# Patient Record
Sex: Male | Born: 1935 | Smoking: Former smoker
Health system: Southern US, Community
[De-identification: ages and names within clinical notes are randomized; demographics above are authoritative.]

## PROBLEM LIST (undated history)

## (undated) DIAGNOSIS — R569 Unspecified convulsions: Secondary | ICD-10-CM

## (undated) DIAGNOSIS — J45909 Unspecified asthma, uncomplicated: Secondary | ICD-10-CM

## (undated) DIAGNOSIS — C61 Malignant neoplasm of prostate: Secondary | ICD-10-CM

## (undated) DIAGNOSIS — J439 Emphysema, unspecified: Secondary | ICD-10-CM

## (undated) HISTORY — DX: Malignant neoplasm of prostate: C61

## (undated) HISTORY — PX: HERNIA REPAIR: SHX51

---

## 2014-03-09 DIAGNOSIS — R627 Adult failure to thrive: Secondary | ICD-10-CM | POA: Insufficient documentation

## 2014-09-11 DIAGNOSIS — F0390 Unspecified dementia without behavioral disturbance: Secondary | ICD-10-CM | POA: Insufficient documentation

## 2017-04-28 ENCOUNTER — Encounter: Payer: Self-pay | Admitting: Neurology

## 2017-07-06 ENCOUNTER — Other Ambulatory Visit: Payer: Self-pay

## 2017-07-06 DIAGNOSIS — J449 Chronic obstructive pulmonary disease, unspecified: Secondary | ICD-10-CM | POA: Insufficient documentation

## 2017-07-06 DIAGNOSIS — E785 Hyperlipidemia, unspecified: Secondary | ICD-10-CM | POA: Insufficient documentation

## 2017-07-06 DIAGNOSIS — I10 Essential (primary) hypertension: Secondary | ICD-10-CM | POA: Insufficient documentation

## 2017-07-06 DIAGNOSIS — G40909 Epilepsy, unspecified, not intractable, without status epilepticus: Secondary | ICD-10-CM | POA: Insufficient documentation

## 2017-07-07 ENCOUNTER — Ambulatory Visit: Payer: Self-pay | Admitting: Neurology

## 2017-09-14 ENCOUNTER — Inpatient Hospital Stay (HOSPITAL_COMMUNITY)
Admission: EM | Admit: 2017-09-14 | Discharge: 2017-09-17 | DRG: 190 | Disposition: A | Discharge status: Home Health | Payer: MEDICARE | Attending: Family Medicine | Admitting: Family Medicine

## 2017-09-14 ENCOUNTER — Observation Stay (HOSPITAL_COMMUNITY): Payer: MEDICARE

## 2017-09-14 ENCOUNTER — Emergency Department (HOSPITAL_COMMUNITY): Payer: MEDICARE

## 2017-09-14 ENCOUNTER — Encounter (HOSPITAL_COMMUNITY): Payer: Self-pay | Admitting: *Deleted

## 2017-09-14 ENCOUNTER — Other Ambulatory Visit: Payer: Self-pay

## 2017-09-14 DIAGNOSIS — Z66 Do not resuscitate: Secondary | ICD-10-CM | POA: Diagnosis present

## 2017-09-14 DIAGNOSIS — G40909 Epilepsy, unspecified, not intractable, without status epilepticus: Secondary | ICD-10-CM

## 2017-09-14 DIAGNOSIS — Z681 Body mass index (BMI) 19 or less, adult: Secondary | ICD-10-CM

## 2017-09-14 DIAGNOSIS — Z9114 Patient's other noncompliance with medication regimen: Secondary | ICD-10-CM

## 2017-09-14 DIAGNOSIS — R000 Tachycardia, unspecified: Secondary | ICD-10-CM | POA: Diagnosis present

## 2017-09-14 DIAGNOSIS — J439 Emphysema, unspecified: Secondary | ICD-10-CM | POA: Diagnosis not present

## 2017-09-14 DIAGNOSIS — Z7982 Long term (current) use of aspirin: Secondary | ICD-10-CM

## 2017-09-14 DIAGNOSIS — J9601 Acute respiratory failure with hypoxia: Secondary | ICD-10-CM | POA: Diagnosis not present

## 2017-09-14 DIAGNOSIS — F0390 Unspecified dementia without behavioral disturbance: Secondary | ICD-10-CM | POA: Diagnosis present

## 2017-09-14 DIAGNOSIS — J449 Chronic obstructive pulmonary disease, unspecified: Secondary | ICD-10-CM | POA: Diagnosis present

## 2017-09-14 DIAGNOSIS — I10 Essential (primary) hypertension: Secondary | ICD-10-CM | POA: Diagnosis present

## 2017-09-14 DIAGNOSIS — R627 Adult failure to thrive: Secondary | ICD-10-CM | POA: Diagnosis present

## 2017-09-14 DIAGNOSIS — I712 Thoracic aortic aneurysm, without rupture: Secondary | ICD-10-CM | POA: Diagnosis present

## 2017-09-14 DIAGNOSIS — D696 Thrombocytopenia, unspecified: Secondary | ICD-10-CM | POA: Diagnosis present

## 2017-09-14 DIAGNOSIS — R9431 Abnormal electrocardiogram [ECG] [EKG]: Secondary | ICD-10-CM | POA: Diagnosis present

## 2017-09-14 DIAGNOSIS — Z79899 Other long term (current) drug therapy: Secondary | ICD-10-CM

## 2017-09-14 DIAGNOSIS — J8410 Pulmonary fibrosis, unspecified: Secondary | ICD-10-CM | POA: Diagnosis present

## 2017-09-14 DIAGNOSIS — Z791 Long term (current) use of non-steroidal anti-inflammatories (NSAID): Secondary | ICD-10-CM

## 2017-09-14 DIAGNOSIS — E785 Hyperlipidemia, unspecified: Secondary | ICD-10-CM | POA: Diagnosis present

## 2017-09-14 DIAGNOSIS — E43 Unspecified severe protein-calorie malnutrition: Secondary | ICD-10-CM

## 2017-09-14 HISTORY — DX: Unspecified convulsions: R56.9

## 2017-09-14 HISTORY — DX: Unspecified asthma, uncomplicated: J45.909

## 2017-09-14 HISTORY — DX: Emphysema, unspecified: J43.9

## 2017-09-14 LAB — CBC
HCT: 43.8 % (ref 39.0–52.0)
HEMOGLOBIN: 13.7 g/dL (ref 13.0–17.0)
MCH: 28.5 pg (ref 26.0–34.0)
MCHC: 31.3 g/dL (ref 30.0–36.0)
MCV: 91.3 fL (ref 78.0–100.0)
PLATELETS: 126 10*3/uL — AB (ref 150–400)
RBC: 4.8 MIL/uL (ref 4.22–5.81)
RDW: 13.2 % (ref 11.5–15.5)
WBC: 2.4 10*3/uL — ABNORMAL LOW (ref 4.0–10.5)

## 2017-09-14 LAB — BRAIN NATRIURETIC PEPTIDE: B NATRIURETIC PEPTIDE 5: 32.9 pg/mL (ref 0.0–100.0)

## 2017-09-14 LAB — BASIC METABOLIC PANEL
Anion gap: 7 (ref 5–15)
BUN: 14 mg/dL (ref 6–20)
CHLORIDE: 97 mmol/L — AB (ref 101–111)
CO2: 34 mmol/L — AB (ref 22–32)
Calcium: 8.8 mg/dL — ABNORMAL LOW (ref 8.9–10.3)
Creatinine, Ser: 0.84 mg/dL (ref 0.61–1.24)
GFR calc Af Amer: >60 mL/min (ref >60)
GFR calc non Af Amer: >60 mL/min (ref >60)
GLUCOSE: 185 mg/dL — AB (ref 65–99)
POTASSIUM: 4.2 mmol/L (ref 3.5–5.1)
Sodium: 138 mmol/L (ref 135–145)

## 2017-09-14 LAB — HEPATIC FUNCTION PANEL
ALBUMIN: 3.6 g/dL (ref 3.5–5.0)
ALK PHOS: 93 U/L (ref 38–126)
ALT: 14 U/L — ABNORMAL LOW (ref 17–63)
AST: 30 U/L (ref 15–41)
BILIRUBIN INDIRECT: 0.3 mg/dL (ref 0.3–0.9)
Bilirubin, Direct: 0.1 mg/dL (ref 0.1–0.5)
TOTAL PROTEIN: 7.1 g/dL (ref 6.5–8.1)
Total Bilirubin: 0.4 mg/dL (ref 0.3–1.2)

## 2017-09-14 LAB — I-STAT TROPONIN, ED: TROPONIN I, POC: 0 ng/mL (ref 0.00–0.08)

## 2017-09-14 MED ORDER — HYDROCODONE-ACETAMINOPHEN 5-325 MG PO TABS
1.0000 | ORAL_TABLET | ORAL | Status: DC | PRN
  Administered 2017-09-15: 1 via ORAL
  Filled 2017-09-14: qty 1

## 2017-09-14 MED ORDER — ONDANSETRON HCL 4 MG/2ML IJ SOLN: 4.0000 mg | Freq: Four times a day (QID) | INTRAMUSCULAR | Status: DC | PRN

## 2017-09-14 MED ORDER — IPRATROPIUM-ALBUTEROL 0.5-2.5 (3) MG/3ML IN SOLN
3.0000 mL | Freq: Four times a day (QID) | RESPIRATORY_TRACT | Status: DC
  Administered 2017-09-14: 3 mL via RESPIRATORY_TRACT
  Filled 2017-09-14: qty 3

## 2017-09-14 MED ORDER — ENOXAPARIN SODIUM 40 MG/0.4ML ~~LOC~~ SOLN
40.0000 mg | SUBCUTANEOUS | Status: DC
  Administered 2017-09-14: 40 mg via SUBCUTANEOUS
  Filled 2017-09-14 (×2): qty 0.4

## 2017-09-14 MED ORDER — IOPAMIDOL (ISOVUE-370) INJECTION 76%
100.0000 mL | Freq: Once | INTRAVENOUS | Status: AC | PRN
  Administered 2017-09-14: 75 mL via INTRAVENOUS

## 2017-09-14 MED ORDER — LEVETIRACETAM 500 MG PO TABS
1000.0000 mg | ORAL_TABLET | Freq: Two times a day (BID) | ORAL | Status: DC
  Administered 2017-09-15 – 2017-09-17 (×5): 1000 mg via ORAL
  Filled 2017-09-14 (×5): qty 2

## 2017-09-14 MED ORDER — ALBUTEROL SULFATE (2.5 MG/3ML) 0.083% IN NEBU: 2.5000 mg | INHALATION_SOLUTION | RESPIRATORY_TRACT | Status: DC | PRN

## 2017-09-14 MED ORDER — FUROSEMIDE 10 MG/ML IJ SOLN
20.0000 mg | Freq: Every day | INTRAMUSCULAR | Status: DC
  Administered 2017-09-14 – 2017-09-17 (×4): 20 mg via INTRAVENOUS
  Filled 2017-09-14 (×4): qty 2

## 2017-09-14 MED ORDER — ASPIRIN EC 81 MG PO TBEC
81.0000 mg | DELAYED_RELEASE_TABLET | Freq: Every day | ORAL | Status: DC
  Administered 2017-09-15 – 2017-09-17 (×3): 81 mg via ORAL
  Filled 2017-09-14 (×3): qty 1

## 2017-09-14 MED ORDER — ONDANSETRON HCL 4 MG PO TABS: 4.0000 mg | ORAL_TABLET | Freq: Four times a day (QID) | ORAL | Status: DC | PRN

## 2017-09-14 MED ORDER — BISACODYL 10 MG RE SUPP: 10.0000 mg | Freq: Every day | RECTAL | Status: DC | PRN

## 2017-09-14 MED ORDER — SENNOSIDES-DOCUSATE SODIUM 8.6-50 MG PO TABS: 1.0000 | ORAL_TABLET | Freq: Every evening | ORAL | Status: DC | PRN

## 2017-09-14 MED ORDER — IOPAMIDOL (ISOVUE-370) INJECTION 76%
INTRAVENOUS | Status: AC
  Filled 2017-09-14: qty 100

## 2017-09-14 MED ORDER — IPRATROPIUM-ALBUTEROL 0.5-2.5 (3) MG/3ML IN SOLN: 3.0000 mL | Freq: Four times a day (QID) | RESPIRATORY_TRACT | Status: DC | PRN

## 2017-09-14 MED ORDER — PHENYTOIN SODIUM EXTENDED 100 MG PO CAPS
100.0000 mg | ORAL_CAPSULE | Freq: Two times a day (BID) | ORAL | Status: DC
  Administered 2017-09-15 – 2017-09-17 (×5): 100 mg via ORAL
  Filled 2017-09-14 (×5): qty 1

## 2017-09-14 MED ORDER — HYDRALAZINE HCL 20 MG/ML IJ SOLN: 5.0000 mg | Freq: Three times a day (TID) | INTRAMUSCULAR | Status: DC | PRN

## 2017-09-14 MED ORDER — ACETAMINOPHEN 650 MG RE SUPP: 650.0000 mg | Freq: Four times a day (QID) | RECTAL | Status: DC | PRN

## 2017-09-14 MED ORDER — ACETAMINOPHEN 325 MG PO TABS
650.0000 mg | ORAL_TABLET | Freq: Four times a day (QID) | ORAL | Status: DC | PRN
  Administered 2017-09-16: 650 mg via ORAL
  Filled 2017-09-14: qty 2

## 2017-09-14 NOTE — ED Notes (Signed)
Report given to Ruth, RN

## 2017-09-14 NOTE — ED Notes (Addendum)
Call when admitted:  Wife: Tivon Lemoine [phone] or POA: Deatra Ina [phone]

## 2017-09-14 NOTE — ED Notes (Signed)
ED TO INPATIENT HANDOFF REPORT  Name/Age/Gender Brent [name] 84 y.o. male  Code Status    Code Status Orders  (From admission, onward)        Start     Ordered   09/14/17 1744  Do not attempt resuscitation (DNR)  Continuous    Question Answer Comment  In the event of cardiac or respiratory ARREST Do not call a "code blue"   In the event of cardiac or respiratory ARREST Do not perform Intubation, CPR, defibrillation or ACLS   In the event of cardiac or respiratory ARREST Use medication by any route, position, wound care, and other measures to relive pain and suffering. May use oxygen, suction and manual treatment of airway obstruction as needed for comfort.      09/14/17 1744    Code Status History    Date Active Date Inactive Code Status Order ID Comments User Context   09/14/2017 1522 09/14/2017 1744 Full Code [id number]  [facility]      Home/SNF/Other Home  Chief Complaint Extreme leg Swelling  Level of Care/Admitting Diagnosis ED Disposition    ED Disposition Condition [place] [place] [702637]  Level of Care: Telemetry [5]  Diagnosis: Acute respiratory failure with hypoxia [facility]) [[id number]]  Admitting Physician: Karmen Bongo [[id number]]  Attending Physician: Karmen Bongo [[id number]]  PT Class (Do Not Modify): Observation [104]  PT Acc Code (Do Not Modify): Observation [10022]       Medical History Past Medical History:  Diagnosis Date  . Asthma   . Cancer ([place])    cancer  . Emphysema of lung ([place])   . Seizures ([place])     Allergies No Known Allergies  IV Location/Drains/Wounds Patient Lines/Drains/Airways Status   Active Line/Drains/Airways    Name:   Placement date:   Placement time:   Site:   Days:   Peripheral IV 09/14/17 Left Wrist   09/14/17    1245    Wrist   less than 1   Peripheral IV 09/14/17 Right Antecubital   09/14/17    1357    Antecubital   less than 1           Labs/Imaging Results for orders placed or performed during the hospital encounter of 09/14/17 (from the past 48 hour(s))  Basic metabolic panel     Status: Abnormal   Collection Time: 09/14/17  9:30 AM  Result Value Ref Range   Sodium 138 135 - 145 mmol/L   Potassium 4.2 3.5 - 5.1 mmol/L   Chloride 97 (L) 101 - 111 mmol/L   CO2 34 (H) 22 - 32 mmol/L   Glucose, Bld 185 (H) 65 - 99 mg/dL   BUN 14 6 - 20 mg/dL   Creatinine, Ser 0.84 0.61 - 1.24 mg/dL   Calcium 8.8 (L) 8.9 - 10.3 mg/dL   GFR calc non Af Amer >60 >60 mL/min   GFR calc Af Amer >60 >60 mL/min    Comment: (NOTE) The eGFR has been calculated using the CKD EPI equation. This calculation has not been validated in all clinical situations. eGFR's persistently <60 mL/min signify possible Chronic Kidney Disease.    Anion gap 7 5 - 15    Comment: Performed at [facility] [address] [place], [place] [address]  CBC     Status: Abnormal   Collection Time: 09/14/17  9:30 AM  Result Value Ref Range   WBC 2.4 (L) 4.0 -  10.5 K/uL   RBC 4.80 4.22 - 5.81 MIL/uL   Hemoglobin 13.7 13.0 - 17.0 g/dL   HCT 43.8 39.0 - 52.0 %   MCV 91.3 78.0 - 100.0 fL   MCH 28.5 26.0 - 34.0 pg   MCHC 31.3 30.0 - 36.0 g/dL   RDW 13.2 11.5 - 15.5 %   Platelets 126 (L) 150 - 400 K/uL    Comment: Performed at [facility] [address], [place], [place] [address]  I-stat troponin, ED     Status: None   Collection Time: 09/14/17  9:40 AM  Result Value Ref Range   Troponin i, poc 0.00 0.00 - 0.08 ng/mL   Comment 3            Comment: Due to the release kinetics of cTnI, a negative result within the first hours of the onset of symptoms does not rule out myocardial infarction with certainty. If myocardial infarction is still suspected, repeat the test at appropriate intervals.   Brain natriuretic peptide     Status: None   Collection Time: 09/14/17 12:23 PM  Result Value Ref Range   B Natriuretic Peptide 32.9 0.0 - 100.0 pg/mL     Comment: Performed at [facility], [place] [address] [place], [place] [address]  Hepatic function panel     Status: Abnormal   Collection Time: 09/14/17 12:23 PM  Result Value Ref Range   Total Protein 7.1 6.5 - 8.1 g/dL   Albumin 3.6 3.5 - 5.0 g/dL   AST 30 15 - 41 U/L   ALT 14 (L) 17 - 63 U/L   Alkaline Phosphatase 93 38 - 126 U/L   Total Bilirubin 0.4 0.3 - 1.2 mg/dL   Bilirubin, Direct 0.1 0.1 - 0.5 mg/dL   Indirect Bilirubin 0.3 0.3 - 0.9 mg/dL    Comment: Performed at [facility] [address] [place], [place] [address]   Dg Chest 2 View  Result Date: 09/14/2017 CLINICAL DATA:  Shortness of breath, lower extremity swelling, history of asthma EXAM: CHEST - 2 VIEW COMPARISON:  None. FINDINGS: The lungs are markedly hyperaerated with increased AP diameter and flattened hemidiaphragms consistent with emphysema. No pneumonia or effusion is seen. A probable small granuloma is noted in the periphery the right upper lung and possibly in the left upper lobe as well. Mediastinal and hilar contours are unremarkable. The heart is mildly enlarged. No acute bony abnormality seen. Moderate thoracic aortic atherosclerosis is noted. IMPRESSION: 1. Emphysema.  No definite active process. 2. Mild cardiomegaly.  Moderate thoracic aortic atherosclerosis. 3. Probable small granulomas in the upper lobes. Electronically Signed   By: Ivar Drape M.D.   On: 09/14/2017 09:56   Ct Angio Chest Pe W And/or Wo Contrast  Result Date: 09/14/2017 CLINICAL DATA:  Chronic dyspnea, LEFT lower extremity swelling from mid shins down for a couple weeks, history asthma, emphysema, COPD, former smoker EXAM: CT ANGIOGRAPHY CHEST WITH CONTRAST TECHNIQUE: Multidetector CT imaging of the chest was performed using the standard protocol during bolus administration of intravenous contrast. Multiplanar CT image reconstructions and MIPs were obtained to evaluate the vascular anatomy. CONTRAST:  72m ISOVUE-370 IOPAMIDOL  (ISOVUE-370) INJECTION 76% IV COMPARISON:  None FINDINGS: Cardiovascular: Atherosclerotic calcifications aorta, proximal great vessels and coronary arteries. Aneurysmal dilatation of the ascending thoracic aorta 4.2 cm diameter image 86. Minimal pericardial effusion. Question LEFT ventricular hypertrophy. Pulmonary arteries well opacified and patent. No evidence of pulmonary embolism. Mediastinum/Nodes: Calcified lymph nodes at  the hila. Esophagus unremarkable. Base of cervical region normal appearance. No definite thoracic adenopathy. Lungs/Pleura: Severe emphysematous changes. Calcified granulomata in RIGHT lung. Scattered peribronchial thickening. No acute infiltrate, pleural effusion or pneumothorax. Mild dependent atelectasis in the posterior lungs bilaterally. Upper Abdomen: Grossly unremarkable, assessment limited by lack of enhancement and fat planes. Musculoskeletal: No acute osseous findings. Review of the MIP images confirms the above findings. IMPRESSION: No evidence pulmonary embolism. Emphysematous and bronchitic changes consistent with COPD with mild associated bibasilar atelectasis. Extensive atherosclerotic calcifications in the chest including coronary arteries and thoracic aorta. Aneurysmal dilatation of ascending thoracic aorta 4.2 cm diameter, recommendation below. Recommend annual imaging followup by CTA or MRA. This recommendation follows 2010 ACCF/AHA/AATS/ACR/ASA/SCA/SCAI/SIR/STS/SVM Guidelines for the Diagnosis and Management of Patients with Thoracic Aortic Disease. Circulation. 2010; 121: P233-A076 Aortic Atherosclerosis (ICD10-I70.0). Aortic aneurysm NOS (ICD10-I71.9). Emphysema (ICD10-J43.9). Electronically Signed   By: Lavonia Dana M.D.   On: 09/14/2017 17:01    Pending Labs Unresulted Labs (From admission, onward)   Start     Ordered   09/15/17 2263  Basic metabolic panel  Tomorrow morning,   R     09/14/17 1522   09/15/17 0500  CBC  Tomorrow morning,   R     09/14/17 1522    09/14/17 1523  Prealbumin  Once,   R     09/14/17 1522   09/14/17 1221  Levetiracetam level  Once,   R     09/14/17 1220      Vitals/Pain Today's Vitals   09/14/17 1840 09/14/17 1840 09/14/17 1841 09/14/17 1900  BP:    (!) 162/67  Pulse:   94 80  Resp:   19 18  Temp:      TempSrc:      SpO2:   97% 99%  PainSc: 0-No pain 0-No pain      Isolation Precautions No active isolations  Medications Medications  iopamidol (ISOVUE-370) 76 % injection (has no administration in time range)  aspirin EC tablet 81 mg (has no administration in time range)  levETIRAcetam (KEPPRA) tablet 1,000 mg (0 mg Oral Hold 09/14/17 2200)  phenytoin (DILANTIN) ER capsule 100 mg (0 mg Oral Hold 09/14/17 2200)  acetaminophen (TYLENOL) tablet 650 mg (has no administration in time range)    Or  acetaminophen (TYLENOL) suppository 650 mg (has no administration in time range)  HYDROcodone-acetaminophen (NORCO/VICODIN) 5-325 MG per tablet 1-2 tablet (has no administration in time range)  senna-docusate (Senokot-S) tablet 1 tablet (has no administration in time range)  bisacodyl (DULCOLAX) suppository 10 mg (has no administration in time range)  ondansetron (ZOFRAN) tablet 4 mg (has no administration in time range)    Or  ondansetron (ZOFRAN) injection 4 mg (has no administration in time range)  enoxaparin (LOVENOX) injection 40 mg (has no administration in time range)  furosemide (LASIX) injection 20 mg (20 mg Intravenous Given 09/14/17 1713)  ipratropium-albuterol (DUONEB) 0.5-2.5 (3) MG/3ML nebulizer solution 3 mL (has no administration in time range)  albuterol (PROVENTIL) (2.5 MG/3ML) 0.083% nebulizer solution 2.5 mg (has no administration in time range)  hydrALAZINE (APRESOLINE) injection 5 mg (has no administration in time range)  iopamidol (ISOVUE-370) 76 % injection 100 mL (75 mLs Intravenous Contrast Given 09/14/17 1627)    Mobility walks with device

## 2017-09-14 NOTE — ED Provider Notes (Signed)
[facility] EMERGENCY DEPARTMENT Provider Note   CSN: [id number] Arrival date & time: 09/14/17  0846     History   Chief Complaint Chief Complaint  Patient presents with  . Shortness of Breath  . Leg Swelling    HPI Brent [name] is a 84 y.o. male.  HPI   Patient is an 84 year old male with past medical history significant for emphysema, COPD, recurrent seizures on Keppra.  Patient presents here today with increasing lower extremity swelling and shortness of breath.  Patient reports that he has gained a lot of weight loss little bit mostly in his lower extremities.  He reports that he does not take any Lasix usual, has not been diagnosed with heart failure.  Patient reports mild shortness of breath as well.  No chest pain.  Past Medical History:  Diagnosis Date  . Asthma   . Cancer ([place])    cancer  . Emphysema of lung ([place])   . Seizures [facility])     Patient Active Problem List   Diagnosis Date Noted  . Recurrent seizures ([place]) 07/06/2017  . Hypertension 07/06/2017  . Hyperlipidemia, unspecified 07/06/2017  . COPD (chronic obstructive pulmonary disease) ([place]) 07/06/2017  . Chronic dementia, without behavioral disturbance 09/11/2014  . Failure to thrive in adult 03/09/2014          Home Medications    Prior to Admission medications   Medication Sig Start Date End Date Taking? Authorizing Provider  albuterol (VENTOLIN HFA) 108 (90 Base) MCG/ACT inhaler Inhale into the lungs. 04/09/17   [provider]  aspirin EC 81 MG tablet Take by mouth.    [provider]  Cholecalciferol (VITAMIN D3) 1000 units CAPS Take by mouth.    [provider]  levETIRAcetam (KEPPRA) 1000 MG tablet Take by mouth. 04/16/17 07/15/17  [provider]  phenytoin (DILANTIN) 100 MG ER capsule TAKE ONE CAPSULE BY MOUTH TWICE A DAY 04/16/17   [provider]    Family History No family history on file.  Social History Social  History   Tobacco Use  . Smoking status: Not on file  Substance Use Topics  . Alcohol use: Not on file  . Drug use: Not on file     Allergies   Patient has no known allergies.   Review of Systems Review of Systems  Constitutional: Positive for fatigue. Negative for activity change.  Respiratory: Positive for cough and shortness of breath.   Cardiovascular: Positive for chest pain.  Gastrointestinal: Negative for abdominal pain.  Musculoskeletal: Positive for joint swelling.  Neurological: Positive for light-headedness.  All other systems reviewed and are negative.    Physical Exam Updated Vital Signs BP (!) 157/70   Pulse 79   Temp 97.8 F (36.6 C) (Oral)   Resp 17   SpO2 99%   Physical Exam  Constitutional: He is oriented to person, place, and time. He appears well-nourished.  Cachetic 84 yo male  HENT:  Head: Normocephalic.  Eyes: Conjunctivae are normal.  Neck: Normal range of motion.  Cardiovascular: Normal rate and regular rhythm.  No murmur heard. Pulmonary/Chest: Accessory muscle usage present. Tachypnea noted. He has decreased breath sounds.  Abdominal: Soft. Bowel sounds are normal. There is no tenderness.  Musculoskeletal: Normal range of motion.       Right lower leg: He exhibits edema.       Left lower leg: He exhibits edema.  Neurological: He is oriented to person, place, and time.  Skin: Skin is warm  and dry. He is not diaphoretic.  Psychiatric: He has a normal mood and affect. His behavior is normal.  Nursing note and vitals reviewed.    ED Treatments / Results  Labs (all labs ordered are listed, but only abnormal results are displayed) Labs Reviewed  BASIC METABOLIC PANEL - Abnormal; Notable for the following components:      Result Value   Chloride 97 (*)    CO2 34 (*)    Glucose, Bld 185 (*)    Calcium 8.8 (*)    All other components within normal limits  CBC - Abnormal; Notable for the following components:   WBC 2.4 (*)     Platelets 126 (*)    All other components within normal limits  BRAIN NATRIURETIC PEPTIDE  LEVETIRACETAM LEVEL  HEPATIC FUNCTION PANEL  I-STAT TROPONIN, ED    EKG EKG Interpretation  Date/Time:  Tuesday Sep 14 2017 09:20:32 EDT Ventricular Rate:  102 PR Interval:  138 QRS Duration: 88 QT Interval:  342 QTC Calculation: 445 R Axis:   86 Text Interpretation:  Sinus tachycardia Possible Left atrial enlargement Borderline ECG isolate ST elevation in V3 Confirmed by [place], [place] [phone]) on 09/14/2017 11:29:28 AM   Radiology Dg Chest 2 View  Result Date: 09/14/2017 CLINICAL DATA:  Shortness of breath, lower extremity swelling, history of asthma EXAM: CHEST - 2 VIEW COMPARISON:  None. FINDINGS: The lungs are markedly hyperaerated with increased AP diameter and flattened hemidiaphragms consistent with emphysema. No pneumonia or effusion is seen. A probable small granuloma is noted in the periphery the right upper lung and possibly in the left upper lobe as well. Mediastinal and hilar contours are unremarkable. The heart is mildly enlarged. No acute bony abnormality seen. Moderate thoracic aortic atherosclerosis is noted. IMPRESSION: 1. Emphysema.  No definite active process. 2. Mild cardiomegaly.  Moderate thoracic aortic atherosclerosis. 3. Probable small granulomas in the upper lobes. Electronically Signed   By: Ivar Drape M.D.   On: 09/14/2017 09:56    Procedures Procedures (including critical care time)  Medications Ordered in ED Medications - No data to display   Initial Impression / Assessment and Plan / ED Course  I have reviewed the triage vital signs and the nursing notes.  Pertinent labs & imaging results that were available during my care of the patient were reviewed by me and considered in my medical decision making (see chart for details).      Patient is an 84 year old male with past medical history significant for emphysema, COPD, recurrent seizures on Keppra.   Patient presents here today with increasing lower extremity swelling and shortness of breath.  Patient reports that he has gained a lot of weight loss little bit mostly in his lower extremities.  He reports that he does not take any Lasix usual, has not been diagnosed with heart failure.  Patient reports mild shortness of breath as well.  No chest pain.   2:36 PM Unfortunately patient is saturating around 84 to 88% on room air.  On 2 L satting comfortably at 97%.  Given patient's 3+ pitting edema bilaterally.,  I am concerned about new heart failure vs pulmonary htn.  Patient is cachectic, and given the gramulatomous findings on x-ray, would like to further evaluate.  CT pending. Will admit for new likely right-sided heart failure and oxygen requirement.  Final Clinical Impressions(s) / ED Diagnoses   Final diagnoses:  None    ED Discharge Orders    None  Macarthur Critchley, MD 09/15/17 1534

## 2017-09-14 NOTE — ED Notes (Signed)
RN attempted to give report RN unavailable  

## 2017-09-14 NOTE — ED Triage Notes (Signed)
Pt has been having LE swelling from mid shin down for a couple of weeks.  Pt reports shortness of breath and pt was 83% on RA and now 97% on 2L/[place]

## 2017-09-14 NOTE — H&P (Addendum)
History and Physical    Jahmez Bily KYH:[id number] DOB: 1927-03-28 DOA: 09/14/2017   PCP: Chase Picket, MD   Patient coming from:  Home    Chief Complaint: Shortness of breath and lower extremity edema  HPI: Brent [name] is a 84 y.o. male with medical history significant for COPD, history of recurrent seizures on Keppra, hypertension, hyperlipidemia, dementia, who has not been seen by her PCP in years, presenting today with increasing lower extremity swelling, and shortness of breath.  The patient reports that over the last couple of months, noted increasing fatigue, and over the last week, his peripheral edema has been more noticeable.  He also reports failure to thrive, and a weight loss of about 30 pounds over the last year.  He denies any history of heart failure, but he does take diuretics for hypertension, although he has not been compliant with the medication.  He denies any cough, or frothy sputum production.  He denies any wheezing.  No chest pain or palpitations.  He denies any abdominal pain, nausea or vomiting.  He does admit to some food indiscretion when he is able to eat.  Any tobacco, alcohol or recreational drug use.  No confusion is reported, although he is very weak, and poor historian.  He is not very mobile, but he denies any recent long distance trip.  He does take aspirin daily.  Nuys any history of PE or DVT.  He makes urine, and he denies any dysuria or gross hematuria.  He denies any recent seizures.  cannula, satting comfortably at 97%.  ED Course:  BP (!) 164/76   Pulse 92   Temp 97.8 F (36.6 C) (Oral)   Resp (!) 21   SpO2 100%   Sodium 138, potassium 4.2, creatinine 0.84 Glucose 185, calcium 8.8, bicarb 34, white count 2.4, platelets 126,000, LFT unremarkable BN P 32.9, troponin 0 Chest x-ray shows emphysema, no definite active process, Garen Lah a small granuloma is noted in the right upper lung and the left upper lobe.  No mediastinal or hilar  lymphadenopathy. CT angiogram of the chest is negative for PE.  Of note, there is a 4.2 cm ascending aortic aneurysm, which will need to be followed up as an outpatient. EKG shows sinus tachycardia, possible LAE, mild ST elevation in V3, will repeat EKG  Presentation, his O2 sats were 84 to 88% in room air, started on 2 L of nasal  Review of Systems:  As per HPI otherwise all other systems reviewed and are negative  Past Medical History:  Diagnosis Date  . Asthma   . Cancer ([place])    cancer  . Emphysema of lung ([place])   . Seizures ([place])     History reviewed. No pertinent surgical history.  Social History Social History   Socioeconomic History  . Marital status: Unknown    Spouse name: Not on file  . Number of children: Not on file  . Years of education: Not on file  . Highest education level: Not on file  Occupational History  . Not on file  Social Needs  . Financial resource strain: Not on file  . Food insecurity:    Worry: Not on file    Inability: Not on file  . Transportation needs:    Medical: Not on file    Non-medical: Not on file  Tobacco Use  . Smoking status: Not on file  Substance and Sexual Activity  . Alcohol use: Not on file  . Drug use: Not  on file  . Sexual activity: Not on file  Lifestyle  . Physical activity:    Days per week: Not on file    Minutes per session: Not on file  . Stress: Not on file  Relationships  . Social connections:    Talks on phone: Not on file    Gets together: Not on file    Attends religious service: Not on file    Active member of club or organization: Not on file    Attends meetings of clubs or organizations: Not on file    Relationship status: Not on file  . Intimate partner violence:    Fear of current or ex partner: Not on file    Emotionally abused: Not on file    Physically abused: Not on file    Forced sexual activity: Not on file  Other Topics Concern  . Not on file  Social History Narrative  . Not on  file     No Known Allergies  History reviewed. No pertinent family history.    Prior to Admission medications   Medication Sig Start Date End Date Taking? Authorizing Provider  albuterol (VENTOLIN HFA) 108 (90 Base) MCG/ACT inhaler Inhale into the lungs. 04/09/17  Yes [provider]  aspirin EC 81 MG tablet Take by mouth.   Yes [provider]  Cholecalciferol (VITAMIN D3) 1000 units CAPS Take 1,000 Units by mouth daily at 12 noon.    Yes [provider]  levETIRAcetam (KEPPRA) 1000 MG tablet Take 1,000 mg by mouth 2 (two) times daily.  04/16/17 09/14/17 Yes [provider]  naproxen sodium (ALEVE) 220 MG tablet Take 220 mg by mouth daily as needed.   Yes [provider]  phenytoin (DILANTIN) 100 MG ER capsule TAKE ONE CAPSULE BY MOUTH TWICE A DAY 04/16/17  Yes [provider]    Physical Exam:  Vitals:   09/14/17 1345 09/14/17 1415 09/14/17 1430 09/14/17 1500  BP: (!) 162/65 (!) 160/61 (!) 155/71 (!) 164/76  Pulse: 73 74 90 92  Resp: 17 17 (!) 28 (!) 21  Temp:      TempSrc:      SpO2: 99% 100% 99% 100%   Constitutional: NAD, calm, uncomfortable due to shortness of breath on exertion Eyes: PERRL, lids and conjunctivae normal ENMT: Mucous membranes are moist, without exudate or lesions  Neck: normal, supple, no masses, no thyromegaly Respiratory: Decreased breath sounds at the bases, no wheezing or rails.  No tachypnea at the time of evaluation.  No accessory muscle use. Cardiovascular: Regular rate and rhythm,  murmur, rubs or gallops.2-3 + Lower  extremity edema bilaterally . 2+ pedal pulses. No carotid bruits.  Abdomen: Soft, non tender, No hepatosplenomegaly. Bowel sounds positive.  Musculoskeletal: no clubbing / cyanosis. Moves all extremities Skin: no jaundice, No lesions.  Neurologic: Sensation intact  Strength equal in all extremities, known dementia  Psychiatric:   Alert and oriented x 2 normal mood.      Labs  on Admission: I have personally reviewed following labs and imaging studies  CBC: Recent Labs  Lab 09/14/17 0930  WBC 2.4*  HGB 13.7  HCT 43.8  MCV 91.3  PLT 126*    Basic Metabolic Panel: Recent Labs  Lab 09/14/17 0930  NA 138  K 4.2  CL 97*  CO2 34*  GLUCOSE 185*  BUN 14  CREATININE 0.84  CALCIUM 8.8*    GFR: CrCl cannot be calculated (Unknown ideal weight.).  Liver Function Tests: Recent Labs  Lab 09/14/17 1223  AST 30  ALT 14*  ALKPHOS 93  BILITOT 0.4  PROT 7.1  ALBUMIN 3.6   No results for input(s): LIPASE, AMYLASE in the last 168 hours. No results for input(s): AMMONIA in the last 168 hours.  Coagulation Profile: No results for input(s): INR, PROTIME in the last 168 hours.  Cardiac Enzymes: No results for input(s): CKTOTAL, CKMB, CKMBINDEX, TROPONINI in the last 168 hours.  BNP (last 3 results) No results for input(s): PROBNP in the last 8760 hours.  HbA1C: No results for input(s): HGBA1C in the last 72 hours.  CBG: No results for input(s): GLUCAP in the last 168 hours.  Lipid Profile: No results for input(s): CHOL, HDL, LDLCALC, TRIG, CHOLHDL, LDLDIRECT in the last 72 hours.  Thyroid Function Tests: No results for input(s): TSH, T4TOTAL, FREET4, T3FREE, THYROIDAB in the last 72 hours.  Anemia Panel: No results for input(s): VITAMINB12, FOLATE, FERRITIN, TIBC, IRON, RETICCTPCT in the last 72 hours.  Urine analysis: No results found for: COLORURINE, APPEARANCEUR, LABSPEC, PHURINE, GLUCOSEU, HGBUR, BILIRUBINUR, KETONESUR, PROTEINUR, UROBILINOGEN, NITRITE, LEUKOCYTESUR  Sepsis Labs: @LABRCNTIP (procalcitonin:4,lacticidven:4) )No results found for this or any previous visit (from the past 240 hour(s)).   Radiological Exams on Admission: Dg Chest 2 View  Result Date: 09/14/2017 CLINICAL DATA:  Shortness of breath, lower extremity swelling, history of asthma EXAM: CHEST - 2 VIEW COMPARISON:  None. FINDINGS: The lungs are markedly  hyperaerated with increased AP diameter and flattened hemidiaphragms consistent with emphysema. No pneumonia or effusion is seen. A probable small granuloma is noted in the periphery the right upper lung and possibly in the left upper lobe as well. Mediastinal and hilar contours are unremarkable. The heart is mildly enlarged. No acute bony abnormality seen. Moderate thoracic aortic atherosclerosis is noted. IMPRESSION: 1. Emphysema.  No definite active process. 2. Mild cardiomegaly.  Moderate thoracic aortic atherosclerosis. 3. Probable small granulomas in the upper lobes. Electronically Signed   By: Ivar Drape M.D.   On: 09/14/2017 09:56    EKG: Independently reviewed.  Assessment/Plan Principal Problem:   Acute respiratory failure with hypoxia (HCC) Active Problems:   Recurrent seizures (HCC)   Hypertension   Hyperlipidemia, unspecified   Failure to thrive in adult   COPD (chronic obstructive pulmonary disease) (HCC)   Chronic dementia, without behavioral disturbance   Acute hypoxic respiratory failure with hypoxia likely  secondary to  COPD exacerbation.  Chest x-ray shows marked hyper radiation, with increased AP diameter, flattened hemidiaphragms consistent with emphysema, no definite active process.  CT angios of the chest is negative for PE.  Known small granulomas in the upper lobes are noted.  Patient was placed on oxygen 2 L with improvement of his symptoms.  He also received nebulizer treatment, with good response.  On presentation, the patient was tachycardic, tachypneic, but not febrile.  White count is 2.5.  Patient is being admitted for management of his symptoms.    Telemetry observation Albuterol every 2 hours as needed, with DuoNeb every 6 hours as needed O2 CBC Blood and sputum cultures  Peripheral edema, unclear etiology.  Patient does take Lasix intermittently, although inconsistent as an outpatient.  Is very frail, and that he might be a malnutrition component.   Creatinine is normal.  GFR is greater than 60 will give Lasix IV 20 mg daily for now, as he is fairly nave to Lasix at this time, may increase to 20 twice daily tomorrow if needed Will check pre-albumin.  Obtain nutritional consultation. PT and  OT Keep legs elevated above heart, TED hose Avoid rich foods   Hypertension BP  153/59   Pulse 76    Lasix as above Hydralazine as needed for BP over 160/90   History of Seizures, no acute issues  Continue present therapy with Keppra and Dilantin    Abnormal EKG, patient asymptomatic, troponin negative. EKG shows sinus tachycardia, possible LAE, mild ST elevation in V3,  will repeat EKG  DVT prophylaxis:  Lovenox Code Status:   DNR  Family Communication:  Discussed with patient Disposition Plan: Expect patient to be discharged to home after condition improves Consults called:    None Admission status: Tele Obs   Sharene Butters, PA-C [facility]   Amion text  [phone]   09/14/2017, 3:24 PM

## 2017-09-14 NOTE — ED Notes (Signed)
Condom cath applied due to pt shortness of breath.

## 2017-09-14 NOTE — ED Notes (Signed)
Placed 60mm external catheter with bed bag and leg strap. No leakage witnessed.

## 2017-09-15 DIAGNOSIS — R9431 Abnormal electrocardiogram [ECG] [EKG]: Secondary | ICD-10-CM | POA: Diagnosis present

## 2017-09-15 DIAGNOSIS — E785 Hyperlipidemia, unspecified: Secondary | ICD-10-CM | POA: Diagnosis present

## 2017-09-15 DIAGNOSIS — J439 Emphysema, unspecified: Secondary | ICD-10-CM | POA: Diagnosis present

## 2017-09-15 DIAGNOSIS — Z9114 Patient's other noncompliance with medication regimen: Secondary | ICD-10-CM | POA: Diagnosis not present

## 2017-09-15 DIAGNOSIS — J8410 Pulmonary fibrosis, unspecified: Secondary | ICD-10-CM | POA: Diagnosis present

## 2017-09-15 DIAGNOSIS — I712 Thoracic aortic aneurysm, without rupture: Secondary | ICD-10-CM | POA: Diagnosis present

## 2017-09-15 DIAGNOSIS — R000 Tachycardia, unspecified: Secondary | ICD-10-CM | POA: Diagnosis present

## 2017-09-15 DIAGNOSIS — Z681 Body mass index (BMI) 19 or less, adult: Secondary | ICD-10-CM | POA: Diagnosis not present

## 2017-09-15 DIAGNOSIS — Z7982 Long term (current) use of aspirin: Secondary | ICD-10-CM | POA: Diagnosis not present

## 2017-09-15 DIAGNOSIS — I10 Essential (primary) hypertension: Secondary | ICD-10-CM | POA: Diagnosis present

## 2017-09-15 DIAGNOSIS — G40909 Epilepsy, unspecified, not intractable, without status epilepticus: Secondary | ICD-10-CM | POA: Diagnosis present

## 2017-09-15 DIAGNOSIS — D696 Thrombocytopenia, unspecified: Secondary | ICD-10-CM | POA: Diagnosis present

## 2017-09-15 DIAGNOSIS — F0390 Unspecified dementia without behavioral disturbance: Secondary | ICD-10-CM | POA: Diagnosis present

## 2017-09-15 DIAGNOSIS — R627 Adult failure to thrive: Secondary | ICD-10-CM | POA: Diagnosis present

## 2017-09-15 DIAGNOSIS — E43 Unspecified severe protein-calorie malnutrition: Secondary | ICD-10-CM

## 2017-09-15 DIAGNOSIS — Z66 Do not resuscitate: Secondary | ICD-10-CM | POA: Diagnosis present

## 2017-09-15 DIAGNOSIS — J9601 Acute respiratory failure with hypoxia: Secondary | ICD-10-CM | POA: Diagnosis present

## 2017-09-15 DIAGNOSIS — Z79899 Other long term (current) drug therapy: Secondary | ICD-10-CM | POA: Diagnosis not present

## 2017-09-15 DIAGNOSIS — Z791 Long term (current) use of non-steroidal anti-inflammatories (NSAID): Secondary | ICD-10-CM | POA: Diagnosis not present

## 2017-09-15 LAB — CBC
HCT: 41.5 % (ref 39.0–52.0)
HEMOGLOBIN: 13.2 g/dL (ref 13.0–17.0)
MCH: 28.8 pg (ref 26.0–34.0)
MCHC: 31.8 g/dL (ref 30.0–36.0)
MCV: 90.6 fL (ref 78.0–100.0)
PLATELETS: 120 10*3/uL — AB (ref 150–400)
RBC: 4.58 MIL/uL (ref 4.22–5.81)
RDW: 13 % (ref 11.5–15.5)
WBC: 2.9 10*3/uL — ABNORMAL LOW (ref 4.0–10.5)

## 2017-09-15 LAB — BASIC METABOLIC PANEL
Anion gap: 13 (ref 5–15)
BUN: 12 mg/dL (ref 6–20)
CO2: 35 mmol/L — ABNORMAL HIGH (ref 22–32)
CREATININE: 0.88 mg/dL (ref 0.61–1.24)
Calcium: 8.9 mg/dL (ref 8.9–10.3)
Chloride: 93 mmol/L — ABNORMAL LOW (ref 101–111)
GFR calc Af Amer: >60 mL/min (ref >60)
GFR calc non Af Amer: >60 mL/min (ref >60)
GLUCOSE: 69 mg/dL (ref 65–99)
POTASSIUM: 4.3 mmol/L (ref 3.5–5.1)
SODIUM: 141 mmol/L (ref 135–145)

## 2017-09-15 LAB — PREALBUMIN: PREALBUMIN: 14.8 mg/dL — AB (ref 18–38)

## 2017-09-15 MED ORDER — IPRATROPIUM-ALBUTEROL 0.5-2.5 (3) MG/3ML IN SOLN
3.0000 mL | Freq: Three times a day (TID) | RESPIRATORY_TRACT | Status: DC
  Administered 2017-09-15 – 2017-09-17 (×6): 3 mL via RESPIRATORY_TRACT
  Filled 2017-09-15 (×7): qty 3

## 2017-09-15 MED ORDER — ENOXAPARIN SODIUM 30 MG/0.3ML ~~LOC~~ SOLN
30.0000 mg | SUBCUTANEOUS | Status: DC
  Administered 2017-09-15 – 2017-09-16 (×2): 30 mg via SUBCUTANEOUS
  Filled 2017-09-15 (×2): qty 0.3

## 2017-09-15 MED ORDER — MIRTAZAPINE 15 MG PO TABS
7.5000 mg | ORAL_TABLET | Freq: Every day | ORAL | Status: DC
Start: 2017-09-15 — End: 2017-09-17
  Administered 2017-09-15 – 2017-09-16 (×2): 7.5 mg via ORAL
  Filled 2017-09-15 (×2): qty 1

## 2017-09-15 MED ORDER — PREDNISONE 20 MG PO TABS
20.0000 mg | ORAL_TABLET | Freq: Every day | ORAL | Status: DC
  Administered 2017-09-15 – 2017-09-17 (×3): 20 mg via ORAL
  Filled 2017-09-15 (×3): qty 1

## 2017-09-15 MED ORDER — ENSURE ENLIVE PO LIQD
237.0000 mL | Freq: Three times a day (TID) | ORAL | Status: DC
  Administered 2017-09-15 – 2017-09-17 (×5): 237 mL via ORAL

## 2017-09-15 MED ORDER — GUAIFENESIN ER 600 MG PO TB12
600.0000 mg | ORAL_TABLET | Freq: Two times a day (BID) | ORAL | Status: DC
  Administered 2017-09-15 – 2017-09-17 (×4): 600 mg via ORAL
  Filled 2017-09-15 (×4): qty 1

## 2017-09-15 NOTE — Progress Notes (Signed)
Nutrition Follow-up  DOCUMENTATION CODES:   Severe malnutrition in context of chronic illness  INTERVENTION:  Ensure Enlive po TID, each supplement provides 350 kcal and 20 grams of protein  Magic cup TID with meals, each supplement provides 290 kcal and 9 grams of protein  MVI w/ minerals  NUTRITION DIAGNOSIS:   Severe Malnutrition related to chronic illness as evidenced by severe muscle depletion, severe fat depletion.  GOAL:   Patient will meet greater than or equal to 90% of their needs  MONITOR:   PO intake, Supplement acceptance, Weight trends, Labs  REASON FOR ASSESSMENT:   Consult Assessment of nutrition requirement/status  ASSESSMENT:   Brent [name] is a 84 y.o. male with a Past Medical History of seizure d/o; COPD; and prostate CA who presents with SOB and LE edema. He reports about 1 week of LE edema.  He has also had SOB that is present at rest but worse with exertion.  RD consulted for nutrition assessment Spoke with patient at bedside. He reports usual intake of: Breakfast - Eggs, sausage, bread and applesauce  Lunch - Skips  Dinner - eats 2nd half of meal he had for breakfast. (He reports it is always a big breakfast).  Also drinks 1-2 ensure a day along with broth and soda.  He denies eating less, but reports UBW of 127 pounds "the last time I went to my family doctor," per care everywhere appears this was 04/16/2017 - but patient was 97 pounds at that time. He was 100 pounds 10/02/2016. Seems his weight has been stable between 95-100 pounds for at least 1 year.  Did not eat any breakfast this morning but per documentation he ate 65% for lunch.  Will monitor PO intake  Labs and medications reviewed.  NUTRITION - FOCUSED PHYSICAL EXAM:    Most Recent Value  Orbital Region  Severe depletion  Upper Arm Region  Severe depletion  Thoracic and Lumbar Region  Severe depletion  Buccal Region  Severe depletion  Temple Region  Severe depletion   Clavicle Bone Region  Severe depletion  Clavicle and Acromion Bone Region  Severe depletion  Scapular Bone Region  Severe depletion  Dorsal Hand  Severe depletion  Patellar Region  Severe depletion  Anterior Thigh Region  Severe depletion  Posterior Calf Region  Unable to assess  Edema (RD Assessment)  Severe       Diet Order:   Diet Order           DIET DYS 3 Room service appropriate? Yes; Fluid consistency: Thin  Diet effective now          EDUCATION NEEDS:   Not appropriate for education at this time  Skin:  Skin Assessment: Reviewed RN Assessment  Last BM:  09/14/2017  Height:   Ht Readings from Last 1 Encounters:  09/14/17 5\' 6"  (1.676 m)    Weight:   Wt Readings from Last 1 Encounters:  09/15/17 95 lb 8 oz (43.3 kg)    Ideal Body Weight:  64.54 kg  BMI:  Body mass index is 15.41 kg/m.  Estimated Nutritional Needs:   Kcal:  1300-1511 calories  Protein:  65-73 grams (1.5-1.7g/kg)  Fluid:  >1.5L  Brent [name]. Brent Blucher, MS, RD LDN Inpatient Clinical Dietitian Pager [phone]

## 2017-09-15 NOTE — Evaluation (Signed)
Occupational Therapy Evaluation Patient Details Name: Brent [name] MRN: [id number] DOB: 08/22/1926 Today's Date: 09/15/2017    History of Present Illness 84 y.o. male admitted with BLE swelling and SOB. PMH includes: COPD, recurrent seizures, HTN, dementia   Clinical Impression   Pt walks short distances with a RW and is assisted for LB dressing, showering and all IADL. Pt presents with decrease activity tolerance and mild balance impairment requiring min guard assist. Will follow acutely.    Follow Up Recommendations  Home health OT    Equipment Recommendations       Recommendations for Other Services       Precautions / Restrictions Precautions Precautions: Fall Precaution Comments: watch O2 sats and HR      Mobility Bed Mobility Overal bed mobility: Needs Assistance Bed Mobility: Sit to Supine       Sit to supine: Supervision      Transfers Overall transfer level: Needs assistance Equipment used: Rolling walker (2 wheeled) Transfers: Sit to/from [facility] Sit to Stand: Min guard Stand pivot transfers: Min guard            Balance                                           ADL either performed or assessed with clinical judgement   ADL Overall ADL's : Needs assistance/impaired Eating/Feeding: Independent;Sitting   Grooming: Wash/dry hands;Sitting;Set up   Upper Body Bathing: Set up;Sitting   Lower Body Bathing: Sit to/from stand;Moderate assistance   Upper Body Dressing : Set up;Sitting   Lower Body Dressing: Moderate assistance;Sit to/from stand   Toilet Transfer: Min guard;RW;Stand-pivot   Toileting- Water quality scientist and Hygiene: Min guard;Sit to/from stand               Vision Baseline Vision/History: Wears glasses Wears Glasses: At all times Patient Visual Report: No change from baseline       Perception     Praxis      Pertinent Vitals/Pain Pain Assessment: No/denies pain      Hand Dominance Right   Extremity/Trunk Assessment Upper Extremity Assessment Upper Extremity Assessment: Overall WFL for tasks assessed;Generalized weakness   Lower Extremity Assessment Lower Extremity Assessment: Defer to PT evaluation   Cervical / Trunk Assessment Cervical / Trunk Assessment: Kyphotic   Communication Communication Communication: HOH   Cognition Arousal/Alertness: Awake/alert Behavior During Therapy: WFL for tasks assessed/performed Overall Cognitive Status: Within Functional Limits for tasks assessed                                 General Comments: somewhat of a poor historian, reported he donned and doffed his socks, but when asked to demonstrate, reported he does not routinely perform, history of dementia   General Comments       Exercises     Shoulder Instructions      Home Living Family/patient expects to be discharged to:: Private residence Living Arrangements: Spouse/significant other Available Help at Discharge: Family;Available 24 hours/day Type of Home: Apartment Home Access: Stairs to enter Entrance Stairs-Number of Steps: 3 Entrance Stairs-Rails: None Home Layout: One level     Bathroom Shower/Tub: Teacher, early years/pre: Standard     Home Equipment: Environmental consultant - 2 wheels;Wheelchair - manual;Shower seat          Prior Functioning/Environment  Level of Independence: Needs assistance  Gait / Transfers Assistance Needed: household ambulation for last month, several steps with RW, becomes SOB.  ADL's / Homemaking Assistance Needed: wife assists showering, LB dressing and all IADL            OT Problem List: Decreased activity tolerance;Impaired balance (sitting and/or standing)      OT Treatment/Interventions: Self-care/ADL training;DME and/or AE instruction;Balance training;Patient/family education;Therapeutic activities;Energy conservation    OT Goals(Current goals can be found in the care plan  section) Acute Rehab OT Goals Patient Stated Goal: go home OT Goal Formulation: With patient Time For Goal Achievement: 09/22/17 Potential to Achieve Goals: Good ADL Goals Pt Will Perform Grooming: with supervision;standing(2 activities) Pt Will Transfer to Toilet: with supervision;ambulating;regular height toilet Pt Will Perform Toileting - Clothing Manipulation and hygiene: with supervision;sit to/from stand Additional ADL Goal #1: Pt will utilize breathing techniques and energy conservation strategies during ADL and mobility with supervision.  OT Frequency: Min 2X/week   Barriers to D/C:            Co-evaluation              AM-PAC PT "6 Clicks" Daily Activity     Outcome Measure Help from another person eating meals?: None Help from another person taking care of personal grooming?: A Little Help from another person toileting, which includes using toliet, bedpan, or urinal?: A Little Help from another person bathing (including washing, rinsing, drying)?: A Lot Help from another person to put on and taking off regular upper body clothing?: A Little Help from another person to put on and taking off regular lower body clothing?: A Lot 6 Click Score: 17   End of Session    Activity Tolerance: Patient tolerated treatment well Patient left: in bed;with call bell/phone within reach;with bed alarm set  OT Visit Diagnosis: Unsteadiness on feet (R26.81);Muscle weakness (generalized) (M62.81)                Time: 1415-1430 OT Time Calculation (min): 15 min Charges:  OT General Charges $OT Visit: 1 Visit OT Evaluation $OT Eval Moderate Complexity: 1 Mod G-Codes:     09/17/17 Nestor Lewandowsky, OTR/L Pager: [phone]  Werner Lean, Haze Boyden 09-17-17, 2:52 PM

## 2017-09-15 NOTE — Evaluation (Signed)
Physical Therapy Evaluation Patient Details Name: Brent [name] MRN: [id number] DOB: April 04, 1927 Today's Date: 09/15/2017   History of Present Illness  84 y.o. male admitted with BLE swelling and SOB. PMH includes: COPD, recurrent seizures, HTN, dementia  Clinical Impression  Pt admitted with above diagnosis. Pt currently with functional limitations due to the deficits listed below (see PT Problem List). PTA, patient living with wife in 1 level apartment with 3 stairs to enter. Over last month patient limited to household ambulation with RW due to SOB, and receive assistance with ADLs from wife. Upon eval pt presents with BLE pain in heels, weakness and deconditioning. Pt has not eaten since Monday and reports feeling weaker than baseline. Pt de-sats on room air to 80% quickly. Pt also de-sat with bed mobility and transfers on 3L to low 80s, returns to 94% with rest. HR 110 at rest, 124 with activity. Min guard level for transfers. Will need to progress to stair training before safe return home, next PT visit. Wife and daughter report confidence in care taking and are eager to have patient progress activity with home therapies.  Pt will benefit from skilled PT to increase their independence and safety with mobility to allow discharge to the venue listed below.       Follow Up Recommendations Home health PT;Supervision/Assistance - 24 hour     Equipment Recommendations  3in1 (PT)    Recommendations for Other Services       Precautions / Restrictions Precautions Precautions: Fall Precaution Comments: watch O2 sats Restrictions Weight Bearing Restrictions: No      Mobility  Bed Mobility Overal bed mobility: Needs Assistance Bed Mobility: Supine to Sit     Supine to sit: Supervision        Transfers Overall transfer level: Needs assistance Equipment used: Rolling walker (2 wheeled) Transfers: Sit to/from [employer] Transfers Sit to Stand: Min guard Stand pivot transfers:  Min guard          Ambulation/Gait             General Gait Details: defered 2/2 fatigue  Stairs            Wheelchair Mobility    Modified Rankin (Stroke Patients Only)       Balance Overall balance assessment: Mild deficits observed, not formally tested                                           Pertinent Vitals/Pain Pain Assessment: 0-10 Pain Score: 10-Worst pain ever Pain Location: Heels Pain Descriptors / Indicators: Burning    Home Living Family/patient expects to be discharged to:: Private residence Living Arrangements: Spouse/significant other Available Help at Discharge: Family;Available 24 hours/day Type of Home: Apartment Home Access: Stairs to enter Entrance Stairs-Rails: None Entrance Stairs-Number of Steps: 3 Home Layout: One level Home Equipment: Walker - 2 wheels;Wheelchair - manual      Prior Function Level of Independence: Needs assistance   Gait / Transfers Assistance Needed: household ambulation for last month, several steps with RW, becomes SOB.   ADL's / Homemaking Assistance Needed: wife assists with ADLs        Hand Dominance        Extremity/Trunk Assessment   Upper Extremity Assessment Upper Extremity Assessment: Defer to OT evaluation;Overall [facility] for tasks assessed    Lower Extremity Assessment Lower Extremity Assessment: Overall WFL for tasks assessed  Cervical / Trunk Assessment Cervical / Trunk Assessment: Kyphotic  Communication   Communication: No difficulties  Cognition Arousal/Alertness: Awake/alert Behavior During Therapy: WFL for tasks assessed/performed Overall Cognitive Status: Within Functional Limits for tasks assessed                                        General Comments General comments (skin integrity, edema, etc.): Discussed home safety with family, wife and daughter agreeable to supervisoin at this point.    Exercises General Exercises - Lower  Extremity Ankle Circles/Pumps: 20 reps Straight Leg Raises: 10 reps   Assessment/Plan    PT Assessment Patient needs continued PT services  PT Problem List Decreased strength;Decreased range of motion;Decreased activity tolerance;Cardiopulmonary status limiting activity;Pain;Decreased mobility;Decreased balance;Decreased cognition       PT Treatment Interventions DME instruction;Gait training;Stair training;Functional mobility training;Therapeutic activities;Therapeutic exercise;Balance training    PT Goals (Current goals can be found in the Care Plan section)  Acute Rehab PT Goals Patient Stated Goal: go home PT Goal Formulation: With patient/family Time For Goal Achievement: 09/22/17 Potential to Achieve Goals: Good    Frequency Min 3X/week   Barriers to discharge        Co-evaluation               AM-PAC PT "6 Clicks" Daily Activity  Outcome Measure Difficulty turning over in bed (including adjusting bedclothes, sheets and blankets)?: None Difficulty moving from lying on back to sitting on the side of the bed? : None Difficulty sitting down on and standing up from a chair with arms (e.g., wheelchair, bedside commode, etc,.)?: None Help needed moving to and from a bed to chair (including a wheelchair)?: A Little Help needed walking in hospital room?: A Little Help needed climbing 3-5 steps with a railing? : A Lot 6 Click Score: 20    End of Session Equipment Utilized During Treatment: Gait belt;Oxygen Activity Tolerance: Patient limited by fatigue Patient left: in chair;with call bell/phone within reach;with family/visitor present Nurse Communication: Mobility status PT Visit Diagnosis: Unsteadiness on feet (R26.81);Pain Pain - part of body: Leg(BL)    Time: 9147-8295 PT Time Calculation (min) (ACUTE ONLY): 36 min   Charges:   PT Evaluation $PT Eval Low Complexity: 1 Low PT Treatments $Therapeutic Activity: 8-22 mins   PT G Codes:        Reinaldo Berber, PT, DPT Acute Rehab Services Pager: [phone]    Reinaldo Berber 09/15/2017, 9:37 AM

## 2017-09-15 NOTE — Evaluation (Signed)
Clinical/Bedside Swallow Evaluation Patient Details  Name: Brent [name] MRN: [id number] Date of Birth: Jul 19, 1926  Today's Date: 09/15/2017 Time: SLP Start Time (ACUTE ONLY): 32 SLP Stop Time (ACUTE ONLY): 1028 SLP Time Calculation (min) (ACUTE ONLY): 22 min  Past Medical History:  Past Medical History:  Diagnosis Date  . Asthma   . Cancer ([place])    cancer  . Emphysema of lung ([place])   . Seizures ([place])    Past Surgical History: History reviewed. No pertinent surgical history. HPI:  85 y.o. male admitted with BLE swelling and SOB. PMH includes: COPD, recurrent seizures, HTN, dementia, cancer, asthma. Found to have COPD exacerbation. Chest CT Severe emphysematous changes. Calcified granulomata in RIGHT lung. Scattered peribronchial thickening. No acute infiltrate, pleural effusion or pneumothorax. Mild dependent atelectasis in the posterior lungs bilaterally. No prior ST notes.   Assessment / Plan / Recommendation Clinical Impression  Pt consumed 3 oz water during the 3 oz water test with immediate, strong cough and decreased respirations requiring 3-4 minutes to recover. Likely/suspected aspiration due to prolonged apneic period during consecutive sips. Small 1-2 sips via cup or straw did not result in s/s aspiration consistently. Mild delayed mastication and transit with solid texture. Educated pt re: increased aspiration risk with COPD and rest breaks if needed and small single sips. Recommend Dys 3, thin liquids, pills whole in applesauce, straws allowed, small sips. Will follow briefly for safety.     SLP Visit Diagnosis: Dysphagia, pharyngeal phase (R13.13)    Aspiration Risk  Moderate aspiration risk    Diet Recommendation Dysphagia 3 (Mech soft);Thin liquid   Liquid Administration via: Cup;Straw Medication Administration: Whole meds with puree Supervision: Patient able to self feed;Intermittent supervision to cue for compensatory strategies Compensations: Slow rate;Small  sips/bites Postural Changes: Seated upright at 90 degrees    Other  Recommendations Oral Care Recommendations: Oral care BID   Follow up Recommendations None      Frequency and Duration min 2x/week  2 weeks       Prognosis Prognosis for Safe Diet Advancement: (fair-good)      Swallow Study   General HPI: 84 y.o. male admitted with BLE swelling and SOB. PMH includes: COPD, recurrent seizures, HTN, dementia, cancer, asthma. Found to have COPD exacerbation. Chest CT Severe emphysematous changes. Calcified granulomata in RIGHT lung. Scattered peribronchial thickening. No acute infiltrate, pleural effusion or pneumothorax. Mild dependent atelectasis in the posterior lungs bilaterally. No prior ST notes. Type of Study: Bedside Swallow Evaluation Previous Swallow Assessment: (none) Diet Prior to this Study: NPO Temperature Spikes Noted: No Respiratory Status: Room air History of Recent Intubation: No Behavior/Cognition: Alert;Cooperative;Pleasant mood Oral Cavity Assessment: Within Functional Limits Oral Care Completed by SLP: No Oral Cavity - Dentition: Poor condition;Missing dentition Vision: Functional for self-feeding Self-Feeding Abilities: Able to feed self Patient Positioning: Upright in chair Baseline Vocal Quality: Normal Volitional Cough: Strong Volitional Swallow: Able to elicit    Oral/Motor/Sensory Function Overall Oral Motor/Sensory Function: Within functional limits   Ice Chips Ice chips: Not tested   Thin Liquid Thin Liquid: Impaired Presentation: Cup;Straw Pharyngeal  Phase Impairments: Cough - Immediate;Throat Clearing - Delayed    Nectar Thick Nectar Thick Liquid: Not tested   Honey Thick Honey Thick Liquid: Not tested   Puree Puree: Within functional limits   Solid   GO   Solid: Impaired Oral Phase Functional Implications: Prolonged oral transit        Houston Siren 09/15/2017,10:42 AM   Orbie Pyo Colvin Caroli.Ed Engineer, agricultural  [phone]       

## 2017-09-15 NOTE — Progress Notes (Signed)
Patient Demographics:    Brent [name], is a 84 y.o. male, DOB - [birth year], QPY:[id number]  Admit date - 09/14/2017   Admitting Physician Karmen Bongo, MD  Outpatient Primary MD for the patient is Chase Picket, MD  LOS - 0   Chief Complaint  Patient presents with  . Shortness of Breath  . Leg Swelling        Subjective:    Brent [name] today has no fevers, no emesis,  No chest pain,    Assessment  & Plan :    Principal Problem:   Acute respiratory failure with hypoxia (HCC) Active Problems:   Recurrent seizures (HCC)   Hypertension   Hyperlipidemia, unspecified   Failure to thrive in adult   COPD (chronic obstructive pulmonary disease) (HCC)   Chronic dementia, without behavioral disturbance   Protein-calorie malnutrition, severe   1) acute hypoxic respiratory failure secondary to COPD exacerbation-CTA chest without PE or other acute findings except for COPD, prednisone 20 mg daily, Mucinex, bronchodilators and supplemental oxygen as ordered  2) severe protein caloric malnutrition-patient was cachectic, albumin is low, developing lower extremity edema, give Remeron for appetite stimulation, give nutritional supplements  3)H/o SZ-stable, continue Dilantin and Keppra  4)HTN-   may use IV Hydralazine 10 mg  Every 4 hours Prn for systolic blood pressure over 160 mmhg  5) low platelets-watch closely while on Lovenox for DVT prophylaxis  Code Status : DNR  Disposition Plan  : TBD  Consults  :  Dietician   DVT Prophylaxis  :  Lovenox -  Lab Results  Component Value Date   PLT 120 (L) 09/15/2017    Inpatient Medications  Scheduled Meds: . aspirin EC  81 mg Oral Daily  . enoxaparin (LOVENOX) injection  30 mg Subcutaneous Q24H  . feeding supplement (ENSURE ENLIVE)  237 mL Oral TID BM  . furosemide  20 mg Intravenous Daily  . ipratropium-albuterol  3 mL Nebulization TID    . levETIRAcetam  1,000 mg Oral BID  . phenytoin  100 mg Oral BID   Continuous Infusions: PRN Meds:.acetaminophen **OR** acetaminophen, albuterol, bisacodyl, hydrALAZINE, HYDROcodone-acetaminophen, ondansetron **OR** ondansetron (ZOFRAN) IV, senna-docusate    Anti-infectives (From admission, onward)   None        Objective:   Vitals:   09/15/17 0402 09/15/17 0921 09/15/17 1127 09/15/17 1158  BP:   132/63 (!) 153/69  Pulse:   100 97  Resp:    20  Temp:    (!) 97.5 F (36.4 C)  TempSrc:    Oral  SpO2:  94%  100%  Weight: 43.3 kg (95 lb 8 oz)     Height:        Wt Readings from Last 3 Encounters:  09/15/17 43.3 kg (95 lb 8 oz)     Intake/Output Summary (Last 24 hours) at 09/15/2017 1818 Last data filed at 09/15/2017 1453 Gross per 24 hour  Intake 240 ml  Output 3050 ml  Net -2810 ml     Physical Exam  Gen:- Awake Alert,  In no apparent distress , cachectic appearing HEENT:- [place].AT, No sclera icterus Neck-Supple Neck,No JVD,.  Lungs-diminished in bases, few scattered wheezes CV- S1, S2 normal Abd-  +ve B.Sounds, Abd Soft, No tenderness,  Extremity/Skin:-1+ pitting edema pedal pulses  psych-affect is appropriate, oriented x3 Neuro-no new focal deficits, no tremors   Data Review:   Micro Results No results found for this or any previous visit (from the past 240 hour(s)).  Radiology Reports Dg Chest 2 View  Result Date: 09/14/2017 CLINICAL DATA:  Shortness of breath, lower extremity swelling, history of asthma EXAM: CHEST - 2 VIEW COMPARISON:  None. FINDINGS: The lungs are markedly hyperaerated with increased AP diameter and flattened hemidiaphragms consistent with emphysema. No pneumonia or effusion is seen. A probable small granuloma is noted in the periphery the right upper lung and possibly in the left upper lobe as well. Mediastinal and hilar contours are unremarkable. The heart is mildly enlarged. No acute bony abnormality seen. Moderate thoracic aortic  atherosclerosis is noted. IMPRESSION: 1. Emphysema.  No definite active process. 2. Mild cardiomegaly.  Moderate thoracic aortic atherosclerosis. 3. Probable small granulomas in the upper lobes. Electronically Signed   By: Ivar Drape M.D.   On: 09/14/2017 09:56   Ct Angio Chest Pe W And/or Wo Contrast  Result Date: 09/14/2017 CLINICAL DATA:  Chronic dyspnea, LEFT lower extremity swelling from mid shins down for a couple weeks, history asthma, emphysema, COPD, former smoker EXAM: CT ANGIOGRAPHY CHEST WITH CONTRAST TECHNIQUE: Multidetector CT imaging of the chest was performed using the standard protocol during bolus administration of intravenous contrast. Multiplanar CT image reconstructions and MIPs were obtained to evaluate the vascular anatomy. CONTRAST:  71mL ISOVUE-370 IOPAMIDOL (ISOVUE-370) INJECTION 76% IV COMPARISON:  None FINDINGS: Cardiovascular: Atherosclerotic calcifications aorta, proximal great vessels and coronary arteries. Aneurysmal dilatation of the ascending thoracic aorta 4.2 cm diameter image 86. Minimal pericardial effusion. Question LEFT ventricular hypertrophy. Pulmonary arteries well opacified and patent. No evidence of pulmonary embolism. Mediastinum/Nodes: Calcified lymph nodes at the hila. Esophagus unremarkable. Base of cervical region normal appearance. No definite thoracic adenopathy. Lungs/Pleura: Severe emphysematous changes. Calcified granulomata in RIGHT lung. Scattered peribronchial thickening. No acute infiltrate, pleural effusion or pneumothorax. Mild dependent atelectasis in the posterior lungs bilaterally. Upper Abdomen: Grossly unremarkable, assessment limited by lack of enhancement and fat planes. Musculoskeletal: No acute osseous findings. Review of the MIP images confirms the above findings. IMPRESSION: No evidence pulmonary embolism. Emphysematous and bronchitic changes consistent with COPD with mild associated bibasilar atelectasis. Extensive atherosclerotic  calcifications in the chest including coronary arteries and thoracic aorta. Aneurysmal dilatation of ascending thoracic aorta 4.2 cm diameter, recommendation below. Recommend annual imaging followup by CTA or MRA. This recommendation follows 2010 ACCF/AHA/AATS/ACR/ASA/SCA/SCAI/SIR/STS/SVM Guidelines for the Diagnosis and Management of Patients with Thoracic Aortic Disease. Circulation. 2010; 121: T024-O973 Aortic Atherosclerosis (ICD10-I70.0). Aortic aneurysm NOS (ICD10-I71.9). Emphysema (ICD10-J43.9). Electronically Signed   By: Lavonia Dana M.D.   On: 09/14/2017 17:01     CBC Recent Labs  Lab 09/14/17 0930 09/15/17 0500  WBC 2.4* 2.9*  HGB 13.7 13.2  HCT 43.8 41.5  PLT 126* 120*  MCV 91.3 90.6  MCH 28.5 28.8  MCHC 31.3 31.8  RDW 13.2 13.0    Chemistries  Recent Labs  Lab 09/14/17 0930 09/14/17 1223 09/15/17 0500  NA 138  --  141  K 4.2  --  4.3  CL 97*  --  93*  CO2 34*  --  35*  GLUCOSE 185*  --  69  BUN 14  --  12  CREATININE 0.84  --  0.88  CALCIUM 8.8*  --  8.9  AST  --  30  --   ALT  --  14*  --  ALKPHOS  --  93  --   BILITOT  --  0.4  --    ------------------------------------------------------------------------------------------------------------------ No results for input(s): CHOL, HDL, LDLCALC, TRIG, CHOLHDL, LDLDIRECT in the last 72 hours.  No results found for: HGBA1C ------------------------------------------------------------------------------------------------------------------ No results for input(s): TSH, T4TOTAL, T3FREE, THYROIDAB in the last 72 hours.  Invalid input(s): FREET3 ------------------------------------------------------------------------------------------------------------------ No results for input(s): VITAMINB12, FOLATE, FERRITIN, TIBC, IRON, RETICCTPCT in the last 72 hours.  Coagulation profile No results for input(s): INR, PROTIME in the last 168 hours.  No results for input(s): DDIMER in the last 72 hours.  Cardiac Enzymes No  results for input(s): CKMB, TROPONINI, MYOGLOBIN in the last 168 hours.  Invalid input(s): CK ------------------------------------------------------------------------------------------------------------------    Component Value Date/Time   BNP 32.9 09/14/2017 1223     Roxan Hockey M.D on 09/15/2017 at 6:18 PM  Between 7am to 7pm - Pager - [phone]  After 7pm go to [email or web address] - password [email or web address]  [facility] -  Office  [phone]   Voice Recognition Viviann Spare dictation system was used to create this note, attempts have been made to correct errors. Please contact the author with questions and/or clarifications.

## 2017-09-15 NOTE — Progress Notes (Signed)
Patient resting comfortably during shift report. Denies complaints.  

## 2017-09-16 LAB — CBC
HCT: 44.2 % (ref 39.0–52.0)
HEMOGLOBIN: 13.7 g/dL (ref 13.0–17.0)
MCH: 28.8 pg (ref 26.0–34.0)
MCHC: 31 g/dL (ref 30.0–36.0)
MCV: 92.9 fL (ref 78.0–100.0)
Platelets: UNDETERMINED 10*3/uL (ref 150–400)
RBC: 4.76 MIL/uL (ref 4.22–5.81)
RDW: 13.1 % (ref 11.5–15.5)
WBC: 2.8 10*3/uL — ABNORMAL LOW (ref 4.0–10.5)

## 2017-09-16 LAB — BASIC METABOLIC PANEL
Anion gap: 7 (ref 5–15)
BUN: 21 mg/dL — ABNORMAL HIGH (ref 6–20)
CO2: 41 mmol/L — ABNORMAL HIGH (ref 22–32)
Calcium: 9 mg/dL (ref 8.9–10.3)
Chloride: 92 mmol/L — ABNORMAL LOW (ref 101–111)
Creatinine, Ser: 0.91 mg/dL (ref 0.61–1.24)
GFR calc non Af Amer: >60 mL/min (ref >60)
Glucose, Bld: 109 mg/dL — ABNORMAL HIGH (ref 65–99)
POTASSIUM: 4.6 mmol/L (ref 3.5–5.1)
SODIUM: 140 mmol/L (ref 135–145)

## 2017-09-16 LAB — LEVETIRACETAM LEVEL: Levetiracetam Lvl: 16.3 ug/mL (ref 10.0–40.0)

## 2017-09-16 NOTE — Progress Notes (Signed)
Patient Demographics:    Brent [name], is a 84 y.o. male, DOB - 1926/06/08, OIT:[id number]  Admit date - 09/14/2017   Admitting Physician Karmen Bongo, MD  Outpatient Primary MD for the patient is Chase Picket, MD  LOS - 1   Chief Complaint  Patient presents with  . Shortness of Breath  . Leg Swelling        Subjective:    Brent [name] today has no fevers, no emesis,  No chest pain, continues to be hypoxic, continues to require oxygen up to 3 L/min,  Assessment  & Plan :    Principal Problem:   Acute respiratory failure with hypoxia ([place]) Active Problems:   Recurrent seizures (HCC)   Hypertension   Hyperlipidemia, unspecified   Failure to thrive in adult   COPD (chronic obstructive pulmonary disease) (HCC)   Chronic dementia, without behavioral disturbance   Protein-calorie malnutrition, severe   1) acute hypoxic respiratory failure secondary to COPD exacerbation-CTA chest without PE or other acute findings except for COPD, apraxia, shortness of breath and cough persist, continue prednisone 20 mg daily, Mucinex, bronchodilators and supplemental oxygen as ordered, patient will need to be qualified for home O2, get ABG on room air in a.m.  2) severe protein caloric malnutrition-patient was cachectic, albumin is low, developing lower extremity edema, continue Remeron for appetite stimulation, give nutritional supplements  3)H/o SZ-stable, continue Dilantin and Keppra  4)HTN-   may use IV Hydralazine 10 mg  Every 4 hours Prn for systolic blood pressure over 160 mmhg  5) low platelets-watch closely while on Lovenox for DVT prophylaxis  Code Status : DNR  Disposition Plan  : TBD  Consults  :  Dietician   DVT Prophylaxis  :  Lovenox -  Lab Results  Component Value Date   PLT PLATELET CLUMPS NOTED ON SMEAR, UNABLE TO ESTIMATE 09/16/2017    Inpatient Medications  Scheduled  Meds: . aspirin EC  81 mg Oral Daily  . enoxaparin (LOVENOX) injection  30 mg Subcutaneous Q24H  . feeding supplement (ENSURE ENLIVE)  237 mL Oral TID BM  . furosemide  20 mg Intravenous Daily  . guaiFENesin  600 mg Oral BID  . ipratropium-albuterol  3 mL Nebulization TID  . levETIRAcetam  1,000 mg Oral BID  . mirtazapine  7.5 mg Oral QHS  . phenytoin  100 mg Oral BID  . predniSONE  20 mg Oral Q breakfast   Continuous Infusions: PRN Meds:.acetaminophen **OR** acetaminophen, albuterol, bisacodyl, hydrALAZINE, HYDROcodone-acetaminophen, ondansetron **OR** ondansetron (ZOFRAN) IV, senna-docusate    Anti-infectives (From admission, onward)   None        Objective:   Vitals:   09/16/17 0355 09/16/17 0848 09/16/17 1232 09/16/17 1436  BP: (!) 146/68  (!) 114/56   Pulse: 98 92 84 94  Resp: 16 16 18 18   Temp: (!) 97.5 F (36.4 C)  (!) 97.5 F (36.4 C)   TempSrc: Oral  Oral   SpO2: 100% 98% 99% 92%  Weight: 43 kg (94 lb 11.2 oz)     Height:        Wt Readings from Last 3 Encounters:  09/16/17 43 kg (94 lb 11.2 oz)     Intake/Output Summary (Last 24 hours) at 09/16/2017 1525 Last  data filed at 09/16/2017 1500 Gross per 24 hour  Intake 960 ml  Output 1100 ml  Net -140 ml     Physical Exam  Gen:- Awake Alert,  In no apparent distress , cachectic appearing HEENT:- [place].AT, No sclera icterus Nose- [place] 2 L/min Neck-Supple Neck,No JVD,.  Lungs-diminished in bases, few scattered wheezes CV- S1, S2 normal Abd-  +ve B.Sounds, Abd Soft, No tenderness,    Extremity/Skin:-1+ pitting edema pedal pulses  psych-affect is appropriate, oriented x3 Neuro-no new focal deficits, no tremors   Data Review:   Micro Results No results found for this or any previous visit (from the past 240 hour(s)).  Radiology Reports Dg Chest 2 View  Result Date: 09/14/2017 CLINICAL DATA:  Shortness of breath, lower extremity swelling, history of asthma EXAM: CHEST - 2 VIEW COMPARISON:  None.  FINDINGS: The lungs are markedly hyperaerated with increased AP diameter and flattened hemidiaphragms consistent with emphysema. No pneumonia or effusion is seen. A probable small granuloma is noted in the periphery the right upper lung and possibly in the left upper lobe as well. Mediastinal and hilar contours are unremarkable. The heart is mildly enlarged. No acute bony abnormality seen. Moderate thoracic aortic atherosclerosis is noted. IMPRESSION: 1. Emphysema.  No definite active process. 2. Mild cardiomegaly.  Moderate thoracic aortic atherosclerosis. 3. Probable small granulomas in the upper lobes. Electronically Signed   By: Ivar Drape M.D.   On: 09/14/2017 09:56   Ct Angio Chest Pe W And/or Wo Contrast  Result Date: 09/14/2017 CLINICAL DATA:  Chronic dyspnea, LEFT lower extremity swelling from mid shins down for a couple weeks, history asthma, emphysema, COPD, former smoker EXAM: CT ANGIOGRAPHY CHEST WITH CONTRAST TECHNIQUE: Multidetector CT imaging of the chest was performed using the standard protocol during bolus administration of intravenous contrast. Multiplanar CT image reconstructions and MIPs were obtained to evaluate the vascular anatomy. CONTRAST:  36mL ISOVUE-370 IOPAMIDOL (ISOVUE-370) INJECTION 76% IV COMPARISON:  None FINDINGS: Cardiovascular: Atherosclerotic calcifications aorta, proximal great vessels and coronary arteries. Aneurysmal dilatation of the ascending thoracic aorta 4.2 cm diameter image 86. Minimal pericardial effusion. Question LEFT ventricular hypertrophy. Pulmonary arteries well opacified and patent. No evidence of pulmonary embolism. Mediastinum/Nodes: Calcified lymph nodes at the hila. Esophagus unremarkable. Base of cervical region normal appearance. No definite thoracic adenopathy. Lungs/Pleura: Severe emphysematous changes. Calcified granulomata in RIGHT lung. Scattered peribronchial thickening. No acute infiltrate, pleural effusion or pneumothorax. Mild dependent  atelectasis in the posterior lungs bilaterally. Upper Abdomen: Grossly unremarkable, assessment limited by lack of enhancement and fat planes. Musculoskeletal: No acute osseous findings. Review of the MIP images confirms the above findings. IMPRESSION: No evidence pulmonary embolism. Emphysematous and bronchitic changes consistent with COPD with mild associated bibasilar atelectasis. Extensive atherosclerotic calcifications in the chest including coronary arteries and thoracic aorta. Aneurysmal dilatation of ascending thoracic aorta 4.2 cm diameter, recommendation below. Recommend annual imaging followup by CTA or MRA. This recommendation follows 2010 ACCF/AHA/AATS/ACR/ASA/SCA/SCAI/SIR/STS/SVM Guidelines for the Diagnosis and Management of Patients with Thoracic Aortic Disease. Circulation. 2010; 121: M196-Q229 Aortic Atherosclerosis (ICD10-I70.0). Aortic aneurysm NOS (ICD10-I71.9). Emphysema (ICD10-J43.9). Electronically Signed   By: Lavonia Dana M.D.   On: 09/14/2017 17:01     CBC Recent Labs  Lab 09/14/17 0930 09/15/17 0500 09/16/17 0448  WBC 2.4* 2.9* 2.8*  HGB 13.7 13.2 13.7  HCT 43.8 41.5 44.2  PLT 126* 120* PLATELET CLUMPS NOTED ON SMEAR, UNABLE TO ESTIMATE  MCV 91.3 90.6 92.9  MCH 28.5 28.8 28.8  MCHC 31.3 31.8 31.0  RDW 13.2 13.0 13.1    Chemistries  Recent Labs  Lab 09/14/17 0930 09/14/17 1223 09/15/17 0500 09/16/17 0448  NA 138  --  141 140  K 4.2  --  4.3 4.6  CL 97*  --  93* 92*  CO2 34*  --  35* 41*  GLUCOSE 185*  --  69 109*  BUN 14  --  12 21*  CREATININE 0.84  --  0.88 0.91  CALCIUM 8.8*  --  8.9 9.0  AST  --  30  --   --   ALT  --  14*  --   --   ALKPHOS  --  93  --   --   BILITOT  --  0.4  --   --    ------------------------------------------------------------------------------------------------------------------ No results for input(s): CHOL, HDL, LDLCALC, TRIG, CHOLHDL, LDLDIRECT in the last 72 hours.  No results found for:  HGBA1C ------------------------------------------------------------------------------------------------------------------ No results for input(s): TSH, T4TOTAL, T3FREE, THYROIDAB in the last 72 hours.  Invalid input(s): FREET3 ------------------------------------------------------------------------------------------------------------------ No results for input(s): VITAMINB12, FOLATE, FERRITIN, TIBC, IRON, RETICCTPCT in the last 72 hours.  Coagulation profile No results for input(s): INR, PROTIME in the last 168 hours.  No results for input(s): DDIMER in the last 72 hours.  Cardiac Enzymes No results for input(s): CKMB, TROPONINI, MYOGLOBIN in the last 168 hours.  Invalid input(s): CK ------------------------------------------------------------------------------------------------------------------    Component Value Date/Time   BNP 32.9 09/14/2017 1223     Roxan Hockey M.D on 09/16/2017 at 3:25 PM  Between 7am to 7pm - Pager - [phone]  After 7pm go to [email or web address] - password [email or web address]  [facility] -  Office  [phone]   Voice Recognition Viviann Spare dictation system was used to create this note, attempts have been made to correct errors. Please contact the author with questions and/or clarifications.

## 2017-09-16 NOTE — Progress Notes (Signed)
Physical Therapy Treatment Patient Details Name: Brent [name] MRN: [id number] DOB: 1926-07-30 Today's Date: 09/16/2017    History of Present Illness 84 y.o. male admitted with BLE swelling and SOB. PMH includes: COPD, recurrent seizures, HTN, dementia    PT Comments    Patient progressing with therapy this visit, able to tolerate short distance ambulation this visit (58') with 1 seated rest break. Patient very deconditioned and weak, requires minimal guarding with OOB mobility and use of RW. Family not present today to focus on stair training/guarding, will cont to work with and progress.     Follow Up Recommendations  Home health PT;Supervision/Assistance - 24 hour     Equipment Recommendations  3in1 (PT)    Recommendations for Other Services       Precautions / Restrictions Precautions Precautions: Fall Precaution Comments: watch O2 sats and HR Restrictions Weight Bearing Restrictions: No    Mobility  Bed Mobility Overal bed mobility: Needs Assistance Bed Mobility: Sit to Supine     Supine to sit: Supervision Sit to supine: Supervision      Transfers Overall transfer level: Needs assistance Equipment used: Rolling walker (2 wheeled) Transfers: Sit to/from [facility] Sit to Stand: Min guard Stand pivot transfers: Min guard          Ambulation/Gait Ambulation/Gait assistance: Min guard Ambulation Distance (Feet): 60 Feet Assistive device: Rolling walker (2 wheeled) Gait Pattern/deviations: Step-to pattern;Step-through pattern;Trunk flexed     General Gait Details: patient ambulating with RW, cues for proxmity. 1 seated rest break. unable to obtain SpO2 on pulse ox due to cold fingers, mild dyspnea on 3L, min guard at this time   Marine scientist [place] (Stroke Patients Only)       Balance Overall balance assessment: Needs assistance   Sitting balance-Leahy Scale: Fair       Standing  balance-Leahy Scale: Fair                              Cognition Arousal/Alertness: Awake/alert Behavior During Therapy: WFL for tasks assessed/performed Overall Cognitive Status: No family/caregiver present to determine baseline cognitive functioning                                        Exercises General Exercises - Lower Extremity Ankle Circles/Pumps: 20 reps Straight Leg Raises: 10 reps    General Comments        Pertinent Vitals/Pain Pain Assessment: No/denies pain    Home Living                      Prior Function            PT Goals (current goals can now be found in the care plan section) Acute Rehab PT Goals Patient Stated Goal: go home PT Goal Formulation: With patient/family Time For Goal Achievement: 09/22/17 Potential to Achieve Goals: Good Progress towards PT goals: Progressing toward goals    Frequency    Min 3X/week      PT Plan Current plan remains appropriate    Co-evaluation              AM-PAC PT "6 Clicks" Daily Activity  Outcome Measure  Difficulty turning over in bed (including adjusting bedclothes, sheets and blankets)?: None  Difficulty moving from lying on back to sitting on the side of the bed? : None Difficulty sitting down on and standing up from a chair with arms (e.g., wheelchair, bedside commode, etc,.)?: None Help needed moving to and from a bed to chair (including a wheelchair)?: A Little Help needed walking in hospital room?: A Little Help needed climbing 3-5 steps with a railing? : A Lot 6 Click Score: 20    End of Session Equipment Utilized During Treatment: Gait belt;Oxygen Activity Tolerance: Patient limited by fatigue Patient left: in bed;with call bell/phone within reach Nurse Communication: Mobility status PT Visit Diagnosis: Unsteadiness on feet (R26.81);Pain     Time: 3729-0211 PT Time Calculation (min) (ACUTE ONLY): 20 min  Charges:  $Gait Training: 8-22  mins                    G Codes:       Reinaldo Berber, PT, DPT Acute Rehab Services Pager: [phone]     Reinaldo Berber 09/16/2017, 1:32 PM

## 2017-09-17 LAB — CBC
HCT: 39.3 % (ref 39.0–52.0)
HEMOGLOBIN: 12.2 g/dL — AB (ref 13.0–17.0)
MCH: 28.6 pg (ref 26.0–34.0)
MCHC: 31 g/dL (ref 30.0–36.0)
MCV: 92.3 fL (ref 78.0–100.0)
Platelets: 122 10*3/uL — ABNORMAL LOW (ref 150–400)
RBC: 4.26 MIL/uL (ref 4.22–5.81)
RDW: 12.8 % (ref 11.5–15.5)
WBC: 3.1 10*3/uL — AB (ref 4.0–10.5)

## 2017-09-17 LAB — BLOOD GAS, ARTERIAL
Acid-Base Excess: 13.7 mmol/L — ABNORMAL HIGH (ref 0.0–2.0)
Bicarbonate: 39.5 mmol/L — ABNORMAL HIGH (ref 20.0–28.0)
Drawn by: 30136
O2 Saturation: 76 %
PATIENT TEMPERATURE: 98
PO2 ART: 41.9 mmHg — AB (ref 83.0–108.0)
pCO2 arterial: 68.9 mmHg (ref 32.0–48.0)
pH, Arterial: 7.375 (ref 7.350–7.450)

## 2017-09-17 MED ORDER — GUAIFENESIN ER 600 MG PO TB12: 600.0000 mg | ORAL_TABLET | Freq: Two times a day (BID) | ORAL | 2 refills | Status: AC

## 2017-09-17 MED ORDER — TIOTROPIUM BROMIDE MONOHYDRATE 18 MCG IN CAPS: 18.0000 ug | ORAL_CAPSULE | Freq: Every day | RESPIRATORY_TRACT | 2 refills | Status: DC

## 2017-09-17 MED ORDER — ALBUTEROL SULFATE (2.5 MG/3ML) 0.083% IN NEBU: 2.5000 mg | INHALATION_SOLUTION | RESPIRATORY_TRACT | 12 refills | Status: DC | PRN

## 2017-09-17 MED ORDER — ALBUTEROL SULFATE HFA 108 (90 BASE) MCG/ACT IN AERS: 2.0000 | INHALATION_SPRAY | RESPIRATORY_TRACT | 1 refills | Status: DC | PRN

## 2017-09-17 MED ORDER — ENSURE ENLIVE PO LIQD: 237.0000 mL | Freq: Three times a day (TID) | ORAL | 12 refills | Status: DC

## 2017-09-17 MED ORDER — OMEPRAZOLE 20 MG PO CPDR: 20.0000 mg | DELAYED_RELEASE_CAPSULE | Freq: Every day | ORAL | 2 refills | Status: AC

## 2017-09-17 MED ORDER — MIRTAZAPINE 7.5 MG PO TABS: 7.5000 mg | ORAL_TABLET | Freq: Every day | ORAL | 2 refills | Status: DC

## 2017-09-17 MED ORDER — ASPIRIN EC 81 MG PO TBEC: 81.0000 mg | DELAYED_RELEASE_TABLET | Freq: Every day | ORAL | 2 refills | Status: DC

## 2017-09-17 MED ORDER — PREDNISONE 20 MG PO TABS: 20.0000 mg | ORAL_TABLET | Freq: Every day | ORAL | 0 refills | Status: AC

## 2017-09-17 MED ORDER — PHENYTOIN SODIUM EXTENDED 100 MG PO CAPS: 100.0000 mg | ORAL_CAPSULE | Freq: Two times a day (BID) | ORAL | 2 refills | Status: AC

## 2017-09-17 MED ORDER — LEVETIRACETAM 1000 MG PO TABS: 1000.0000 mg | ORAL_TABLET | Freq: Two times a day (BID) | ORAL | 5 refills | Status: DC

## 2017-09-17 NOTE — Care Management Note (Addendum)
Case Management Note  Patient Details  Name: Brent [name] MRN: [id number] Date of Birth: 1926-11-19  Subjective/Objective:  Admitted for Acute respiratory failure with hypoxia. PCP noted.   Uninsured  Action/Plan: Prior to admission patient lived at home with wife.  At discharge patient plans to return to same living situation.  Physical therapy recommended home health PT/OT.  There is also recommendation for Home DME: Oxygen.  NCM called referral for Home Health services and DME to discharge liaison Linna Hoff to access patient for [facility] assistance.  In to speak with patient, wife at bedside.  Permission given to speak in front of wife.  Was brought to my attention that patient needed assistance with medications since uninsured. NCM provided the [facility] PROGRAM Letter, explained the limitation of usage (once within 12 month calendar year).  Wife/patient verbalized understanding.  NCM will continue to follow for discharge transition needs.  Expected Discharge Date:  09/16/17               Expected Discharge Plan:  [place]  In-House Referral:    N/A Discharge planning Services  CM Consult  Post Acute Care Choice:   home health Choice offered to:   Patient  DME Arranged:  Oxygen DME Agency:  [place] Arranged:  PT, OT [facility] Agency:  [place]  Status of Service:   Complete, signing off  Kristen Cardinal, RN 09/17/2017, 10:18 AM

## 2017-09-17 NOTE — Care Management Note (Deleted)
Case Management Note  Patient Details  Name: Brent [name] MRN: [id number] Date of Birth: [birth year]  Subjective/Objective:                    Action/Plan:   Expected Discharge Date:  09/16/17               Expected Discharge Plan:     In-House Referral:     Discharge planning Services     Post Acute Care Choice:    Choice offered to:     DME Arranged:    DME Agency:     HH Arranged:    [place] Agency:     Status of Service:     If discussed at [employer] of Stay Meetings, dates discussed:    Additional Comments:  Kristen Cardinal, RN 09/17/2017, 10:02 AM

## 2017-09-17 NOTE — Discharge Instructions (Signed)
1) take medications including oxygen as prescribed 2) follow-up with your primary care doctor in 1 to 2 weeks for recheck 3) low-salt diet advised

## 2017-09-17 NOTE — Progress Notes (Signed)
Occupational Therapy Treatment Patient Details Name: Brent [name] MRN: [id number] DOB: 06-16-1926 Today's Date: 09/17/2017    History of present illness 84 y.o. male admitted with BLE swelling and SOB. PMH includes: COPD, recurrent seizures, HTN, dementia   OT comments  Pt progressing towards acute OT goals. Decreased balance, activity tolerance, and + dizziness when OOB impacting level of assist with ADLs. Pt completed 1 grooming task standing at sink in trunk flexed position with BUE providing external support. Fatigued with minimal activity but declined seated rest break. Pt did verbalize concerns about d/cing home today, nursing notified. No family present during OT session. Pt on 3L O2 throughout session.    Follow Up Recommendations  Home health OT    Equipment Recommendations       Recommendations for Other Services      Precautions / Restrictions Precautions Precautions: Fall Precaution Comments: watch O2 sats and HR Restrictions Weight Bearing Restrictions: No       Mobility Bed Mobility Overal bed mobility: Needs Assistance Bed Mobility: Sit to Supine     Supine to sit: Supervision Sit to supine: Supervision   General bed mobility comments: up in chair  Transfers Overall transfer level: Needs assistance Equipment used: Rolling walker (2 wheeled) Transfers: Sit to/from Stand Sit to Stand: Supervision;Min guard Stand pivot transfers: Supervision;Min guard       General transfer comment: to/from recliner. no physical assist. min guard for safety    Balance Overall balance assessment: Needs assistance   Sitting balance-Leahy Scale: Fair       Standing balance-Leahy Scale: Fair                             ADL either performed or assessed with clinical judgement   ADL Overall ADL's : Needs assistance/impaired     Grooming: Min guard;Wash/dry hands;Standing Grooming Details (indicate cue type and reason): pt stood in trunk flexed  position, resting BUE on edge of sink for support. some dizziness and generalized weakness endorsed by pt.                              Functional mobility during ADLs: Min guard;Rolling walker General ADL Comments: Pt walked recliner to/from sink to stand for grooming task. Discussed some energy conservation and fall prevention strategies. Close min guard for safety.     Vision       Perception     Praxis      Cognition Arousal/Alertness: Awake/alert Behavior During Therapy: WFL for tasks assessed/performed Overall Cognitive Status: No family/caregiver present to determine baseline cognitive functioning                                 General Comments: slow processing, some difficulty sequencing        Exercises General Exercises - Lower Extremity Ankle Circles/Pumps: 20 reps Straight Leg Raises: 10 reps   Shoulder Instructions       General Comments      Pertinent Vitals/ Pain       Pain Assessment: Faces Faces Pain Scale: Hurts a little bit Pain Location: [place]  Prior Functioning/Environment              Frequency  Min 2X/week        Progress Toward Goals  OT Goals(current goals can now be found in the care plan section)  Progress towards OT goals: Progressing toward goals  Acute Rehab OT Goals Patient Stated Goal: go home OT Goal Formulation: With patient Time For Goal Achievement: 09/22/17 Potential to Achieve Goals: Good ADL Goals Pt Will Perform Grooming: with supervision;standing Pt Will Transfer to Toilet: with supervision;ambulating;regular height toilet Pt Will Perform Toileting - Clothing Manipulation and hygiene: with supervision;sit to/from stand Additional ADL Goal #1: Pt will utilize breathing techniques and energy conservation strategies during ADL and mobility with supervision.  Plan Discharge plan remains appropriate     Co-evaluation                 AM-PAC PT "6 Clicks" Daily Activity     Outcome Measure   Help from another person eating meals?: None Help from another person taking care of personal grooming?: A Little Help from another person toileting, which includes using toliet, bedpan, or urinal?: A Little Help from another person bathing (including washing, rinsing, drying)?: A Lot Help from another person to put on and taking off regular upper body clothing?: A Little Help from another person to put on and taking off regular lower body clothing?: A Lot 6 Click Score: 17    End of Session Equipment Utilized During Treatment: Gait belt;Rolling walker  OT Visit Diagnosis: Unsteadiness on feet (R26.81);Muscle weakness (generalized) (M62.81)   Activity Tolerance Patient tolerated treatment well;Patient limited by fatigue;Other (comment)(+ dizziness during ambulation)   Patient Left in chair;with call bell/phone within reach   Nurse Communication          Time: 1218-1232 OT Time Calculation (min): 14 min  Charges: OT General Charges $OT Visit: 1 Visit OT Treatments $Self Care/Home Management : 8-22 mins     Hortencia Pilar 09/17/2017, 12:55 PM

## 2017-09-17 NOTE — Progress Notes (Signed)
SATURATION QUALIFICATIONS: (This note is used to comply with regulatory documentation for home oxygen)  Patient Saturations on Room Air at Rest = 80%  Patient Saturations on Room Air while Ambulating = 80%  Patient Saturations on 3 Liters of oxygen while Ambulating = 90%  Please briefly explain why patient needs home oxygen: Patient DeSats to low 80's on RA.     Reinaldo Berber, PT, DPT Acute Rehab Services Pager: [phone]

## 2017-09-17 NOTE — Progress Notes (Signed)
Physical Therapy Treatment Patient Details Name: Brent [name] MRN: [id number] DOB: 1927-03-18 Today's Date: 09/17/2017    History of Present Illness 84 y.o. male admitted with BLE swelling and SOB. PMH includes: COPD, recurrent seizures, HTN, dementia    PT Comments    Session focused on stair training as patient is preparing to return home. Patient requires hands on assistance on stairs to re-enter home, at most moderate assistance. Family not present to educate on proper guarding, spoke to RN and left number to call when family arrives and will ensure they are comfortable with guarding.      Follow Up Recommendations  Home health PT;Supervision/Assistance - 24 hour     Equipment Recommendations  3in1 (PT)    Recommendations for Other Services       Precautions / Restrictions Precautions Precautions: Fall Precaution Comments: watch O2 sats and HR Restrictions Weight Bearing Restrictions: No    Mobility  Bed Mobility Overal bed mobility: Needs Assistance Bed Mobility: Sit to Supine     Supine to sit: Supervision Sit to supine: Supervision      Transfers Overall transfer level: Needs assistance Equipment used: Rolling walker (2 wheeled) Transfers: Sit to/from [facility] Sit to Stand: Supervision;Min guard Stand pivot transfers: Supervision;Min guard          Ambulation/Gait                 Stairs Stairs: Yes Stairs assistance: Min assist Stair Management: No rails Number of Stairs: 6 General stair comments: min A with stairs with hand held assist. family not present today to educate, will need to discuss with family before patient d/cs. alternating pattern, cues for    Wheelchair Mobility    Modified Rankin (Stroke Patients Only)       Balance Overall balance assessment: Needs assistance   Sitting balance-Leahy Scale: Fair       Standing balance-Leahy Scale: Fair                               Cognition Arousal/Alertness: Awake/alert Behavior During Therapy: WFL for tasks assessed/performed Overall Cognitive Status: No family/caregiver present to determine baseline cognitive functioning                                        Exercises General Exercises - Lower Extremity Ankle Circles/Pumps: 20 reps Straight Leg Raises: 10 reps    General Comments        Pertinent Vitals/Pain Pain Assessment: No/denies pain    Home Living                      Prior Function            PT Goals (current goals can now be found in the care plan section) Acute Rehab PT Goals Patient Stated Goal: go home PT Goal Formulation: With patient/family Time For Goal Achievement: 09/22/17 Potential to Achieve Goals: Good Progress towards PT goals: Progressing toward goals    Frequency    Min 3X/week      PT Plan Current plan remains appropriate    Co-evaluation              AM-PAC PT "6 Clicks" Daily Activity  Outcome Measure  Difficulty turning over in bed (including adjusting bedclothes, sheets and blankets)?: None Difficulty moving from lying on back to  sitting on the side of the bed? : None Difficulty sitting down on and standing up from a chair with arms (e.g., wheelchair, bedside commode, etc,.)?: None Help needed moving to and from a bed to chair (including a wheelchair)?: A Little Help needed walking in hospital room?: A Little Help needed climbing 3-5 steps with a railing? : A Lot 6 Click Score: 20    End of Session Equipment Utilized During Treatment: Gait belt;Oxygen(3L) Activity Tolerance: Patient limited by fatigue Patient left: in bed;with call bell/phone within reach Nurse Communication: Mobility status PT Visit Diagnosis: Unsteadiness on feet (R26.81);Pain     Time: 4656-8127 PT Time Calculation (min) (ACUTE ONLY): 22 min  Charges:  $Gait Training: 8-22 mins                    G Codes:       Reinaldo Berber, PT,  DPT Acute Rehab Services Pager: [phone]     Reinaldo Berber 09/17/2017, 11:41 AM

## 2017-09-17 NOTE — Progress Notes (Signed)
Discharge instructions given to patient. All questions answered. Waiting for case management to get the patient approved for home oxygen use.

## 2017-09-17 NOTE — Discharge Summary (Signed)
Brent [name], is a 84 y.o. male  DOB 22-Dec-1926  MRN [id number].  Admission date:  09/14/2017  Admitting Physician  Karmen Bongo, MD  Discharge Date:  09/17/2017   Primary MD  Chase Picket, MD  Recommendations for primary care physician for things to follow:   1) take medications including oxygen as prescribed 2) follow-up with your primary care doctor in 1 to 2 weeks for recheck 3) low-salt diet advised   Admission Diagnosis  Extreme leg Swelling   Discharge Diagnosis  Extreme leg Swelling    Principal Problem:   Acute respiratory failure with hypoxia ([place]) Active Problems:   Recurrent seizures ([place])   Hypertension   Hyperlipidemia, unspecified   Failure to thrive in adult   COPD (chronic obstructive pulmonary disease) ([place])   Chronic dementia, without behavioral disturbance   Protein-calorie malnutrition, severe      Past Medical History:  Diagnosis Date  . Asthma   . Cancer ([place])    cancer  . Emphysema of lung ([place])   . Seizures ([place])     History reviewed. No pertinent surgical history.     HPI  from the history and physical done on the day of admission:    Patient coming from:  Home    Chief Complaint: Shortness of breath and lower extremity edema  HPI: Brent [name] is a 83 y.o. male with medical history significant for COPD, history of recurrent seizures on Keppra, hypertension, hyperlipidemia, dementia, who has not been seen by her PCP in years, presenting today with increasing lower extremity swelling, and shortness of breath.  The patient reports that over the last couple of months, noted increasing fatigue, and over the last week, his peripheral edema has been more noticeable.  He also reports failure to thrive, and a weight loss of about 30 pounds over the last year.  He denies any history of heart failure, but he does take diuretics for hypertension, although  he has not been compliant with the medication.  He denies any cough, or frothy sputum production.  He denies any wheezing.  No chest pain or palpitations.  He denies any abdominal pain, nausea or vomiting.  He does admit to some food indiscretion when he is able to eat.  Any tobacco, alcohol or recreational drug use.  No confusion is reported, although he is very weak, and poor historian.  He is not very mobile, but he denies any recent long distance trip.  He does take aspirin daily.  Nuys any history of PE or DVT.  He makes urine, and he denies any dysuria or gross hematuria.  He denies any recent seizures.  cannula, satting comfortably at 97%.  ED Course:  BP (!) 164/76   Pulse 92   Temp 97.8 F (36.6 C) (Oral)   Resp (!) 21   SpO2 100%   Sodium 138, potassium 4.2, creatinine 0.84 Glucose 185, calcium 8.8, bicarb 34, white count 2.4, platelets 126,000, LFT unremarkable BN P 32.9, troponin 0 Chest x-ray shows emphysema, no definite active  process, Brent [name] a small granuloma is noted in the right upper lung and the left upper lobe.  No mediastinal or hilar lymphadenopathy. CT angiogram of the chest is negative for PE.  Of note, there is a 4.2 cm ascending aortic aneurysm, which will need to be followed up as an outpatient. EKG shows sinus tachycardia, possible LAE, mild ST elevation in V3, will repeat EKG  Presentation, his O2 sats were 84 to 88% in room air, started on 2 L of nasal    Hospital Course:      1) acute hypoxic respiratory failure secondary to COPD exacerbation-overall much improved, but continues to require oxygen , CTA chest without PE or other acute findings except for COPD, discharged home on prednisone, Mucinex, bronchodilators and supplemental oxygen  2) severe protein caloric malnutrition-patient was cachectic, albumin is low, discharged home on Remeron for appetite stimulation, give nutritional supplements  3)H/o SZ-stable, continue Dilantin and Keppra  4)H/o  HTN-   stable, low-salt diet advised  5) low platelets-platelets are up to 122k, no evidence of bleeding   Discharge Condition: Stable  Follow UP-follow-up with PCP  Diet and Activity recommendation:  As advised  Discharge Instructions     Discharge Instructions    (HEART FAILURE PATIENTS) Call MD:  Anytime you have any of the following symptoms: 1) 3 pound weight gain in 24 hours or 5 pounds in 1 week 2) shortness of breath, with or without a dry hacking cough 3) swelling in the hands, feet or stomach 4) if you have to sleep on extra pillows at night in order to breathe.   Complete by:  As directed    Call MD for:  difficulty breathing, headache or visual disturbances   Complete by:  As directed    Call MD for:  persistant dizziness or light-headedness   Complete by:  As directed    Call MD for:  persistant nausea and vomiting   Complete by:  As directed    Call MD for:  severe uncontrolled pain   Complete by:  As directed    Call MD for:  temperature >100.4   Complete by:  As directed    DME Nebulizer/meds   Complete by:  As directed    Patient needs a nebulizer to treat with the following condition:  COPD (chronic obstructive pulmonary disease) ([place])   Diet - low sodium heart healthy   Complete by:  As directed    Discharge instructions   Complete by:  As directed    1) take medications including oxygen as prescribed 2) follow-up with your primary care doctor in 1 to 2 weeks for recheck 3) low-salt diet advised   Increase activity slowly   Complete by:  As directed         Discharge Medications     Allergies as of 09/17/2017   No Known Allergies     Medication List    STOP taking these medications   naproxen sodium 220 MG tablet Commonly known as:  ALEVE     TAKE these medications   albuterol 108 (90 Base) MCG/ACT inhaler Commonly known as:  VENTOLIN HFA Inhale 2 puffs into the lungs every 4 (four) hours as needed for wheezing or shortness of  breath. What changed:    how much to take  when to take this  reasons to take this   albuterol (2.5 MG/3ML) 0.083% nebulizer solution Commonly known as:  PROVENTIL Take 3 mLs (2.5 mg total) by nebulization every 2 (  two) hours as needed for wheezing or shortness of breath. What changed:  You were already taking a medication with the same name, and this prescription was added. Make sure you understand how and when to take each.   aspirin EC 81 MG tablet Take 1 tablet (81 mg total) by mouth daily. With Breakfast What changed:    how much to take  when to take this  additional instructions   feeding supplement (ENSURE ENLIVE) Liqd Take 237 mLs by mouth 3 (three) times daily between meals.   guaiFENesin 600 MG 12 hr tablet Commonly known as:  MUCINEX Take 1 tablet (600 mg total) by mouth 2 (two) times daily.   levETIRAcetam 1000 MG tablet Commonly known as:  KEPPRA Take 1 tablet (1,000 mg total) by mouth 2 (two) times daily.   mirtazapine 7.5 MG tablet Commonly known as:  REMERON Take 1 tablet (7.5 mg total) by mouth at bedtime. For mood and appetite   omeprazole 20 MG capsule Commonly known as:  PRILOSEC Take 1 capsule (20 mg total) by mouth daily.   phenytoin 100 MG ER capsule Commonly known as:  DILANTIN Take 1 capsule (100 mg total) by mouth 2 (two) times daily.   predniSONE 20 MG tablet Commonly known as:  DELTASONE Take 1 tablet (20 mg total) by mouth daily with breakfast for 5 days. Start taking on:  09/18/2017   tiotropium 18 MCG inhalation capsule Commonly known as:  SPIRIVA HANDIHALER Place 1 capsule (18 mcg total) into inhaler and inhale daily.   Vitamin D3 1000 units Caps Take 1,000 Units by mouth daily at 12 noon.            Durable Medical Equipment  (From admission, onward)        Start     Ordered   09/17/17 1031  DME Oxygen  Once    Comments:  Please see oxygen qualification documentation in EMR  Use Oxygen via nasal cannula at 2  L/min continuously with gaseous portability on conserving device  Question Answer Comment  Mode or (Route) Nasal cannula   Frequency Continuous (stationary and portable oxygen unit needed)   Oxygen conserving device Yes   Oxygen delivery system Gas      09/17/17 1033   09/17/17 0000  DME Nebulizer/meds    Question:  Patient needs a nebulizer to treat with the following condition  Answer:  COPD (chronic obstructive pulmonary disease) ([place])   09/17/17 1033      Major procedures and Radiology Reports - PLEASE review detailed and final reports for all details, in brief -   Dg Chest 2 View  Result Date: 09/14/2017 CLINICAL DATA:  Shortness of breath, lower extremity swelling, history of asthma EXAM: CHEST - 2 VIEW COMPARISON:  None. FINDINGS: The lungs are markedly hyperaerated with increased AP diameter and flattened hemidiaphragms consistent with emphysema. No pneumonia or effusion is seen. A probable small granuloma is noted in the periphery the right upper lung and possibly in the left upper lobe as well. Mediastinal and hilar contours are unremarkable. The heart is mildly enlarged. No acute bony abnormality seen. Moderate thoracic aortic atherosclerosis is noted. IMPRESSION: 1. Emphysema.  No definite active process. 2. Mild cardiomegaly.  Moderate thoracic aortic atherosclerosis. 3. Probable small granulomas in the upper lobes. Electronically Signed   By: Ivar Drape M.D.   On: 09/14/2017 09:56   Ct Angio Chest Pe W And/or Wo Contrast  Result Date: 09/14/2017 CLINICAL DATA:  Chronic dyspnea, LEFT lower extremity  swelling from mid shins down for a couple weeks, history asthma, emphysema, COPD, former smoker EXAM: CT ANGIOGRAPHY CHEST WITH CONTRAST TECHNIQUE: Multidetector CT imaging of the chest was performed using the standard protocol during bolus administration of intravenous contrast. Multiplanar CT image reconstructions and MIPs were obtained to evaluate the vascular anatomy. CONTRAST:   4mL ISOVUE-370 IOPAMIDOL (ISOVUE-370) INJECTION 76% IV COMPARISON:  None FINDINGS: Cardiovascular: Atherosclerotic calcifications aorta, proximal great vessels and coronary arteries. Aneurysmal dilatation of the ascending thoracic aorta 4.2 cm diameter image 86. Minimal pericardial effusion. Question LEFT ventricular hypertrophy. Pulmonary arteries well opacified and patent. No evidence of pulmonary embolism. Mediastinum/Nodes: Calcified lymph nodes at the hila. Esophagus unremarkable. Base of cervical region normal appearance. No definite thoracic adenopathy. Lungs/Pleura: Severe emphysematous changes. Calcified granulomata in RIGHT lung. Scattered peribronchial thickening. No acute infiltrate, pleural effusion or pneumothorax. Mild dependent atelectasis in the posterior lungs bilaterally. Upper Abdomen: Grossly unremarkable, assessment limited by lack of enhancement and fat planes. Musculoskeletal: No acute osseous findings. Review of the MIP images confirms the above findings. IMPRESSION: No evidence pulmonary embolism. Emphysematous and bronchitic changes consistent with COPD with mild associated bibasilar atelectasis. Extensive atherosclerotic calcifications in the chest including coronary arteries and thoracic aorta. Aneurysmal dilatation of ascending thoracic aorta 4.2 cm diameter, recommendation below. Recommend annual imaging followup by CTA or MRA. This recommendation follows 2010 ACCF/AHA/AATS/ACR/ASA/SCA/SCAI/SIR/STS/SVM Guidelines for the Diagnosis and Management of Patients with Thoracic Aortic Disease. Circulation. 2010; 121: P824-M353 Aortic Atherosclerosis (ICD10-I70.0). Aortic aneurysm NOS (ICD10-I71.9). Emphysema (ICD10-J43.9). Electronically Signed   By: Lavonia Dana M.D.   On: 09/14/2017 17:01    Micro Results   No results found for this or any previous visit (from the past 240 hour(s)).     Today   Subjective    Brent [name] today has no complaints, eager to go home           Patient has been seen and examined prior to discharge   Objective   Blood pressure 117/62, pulse (!) 109, temperature 98 F (36.7 C), temperature source Oral, resp. rate 18, height 5\' 6"  (1.676 m), weight 43.5 kg (95 lb 12.8 oz), SpO2 91 %.   Intake/Output Summary (Last 24 hours) at 09/17/2017 1034 Last data filed at 09/17/2017 1031 Gross per 24 hour  Intake 600 ml  Output 1200 ml  Net -600 ml    Exam Gen:- Awake Alert, in no acute distress HEENT:- [place].AT, No sclera icterus Nose-nasal cannula at 2 L/min Neck-Supple Neck,No JVD,.  Lungs-improved air movement, no wheezing  CV- S1, S2 normal Abd-  +ve B.Sounds, Abd Soft, No tenderness,    Extremity/Skin:-Mostly resolved lower extremity edema,   good pulses Psych-affect is appropriate, oriented x3 Neuro-no new focal deficits, no tremors   Data Review   CBC w Diff:  Lab Results  Component Value Date   WBC 3.1 (L) 09/17/2017   HGB 12.2 (L) 09/17/2017   HCT 39.3 09/17/2017   PLT 122 (L) 09/17/2017    CMP:  Lab Results  Component Value Date   NA 140 09/16/2017   K 4.6 09/16/2017   CL 92 (L) 09/16/2017   CO2 41 (H) 09/16/2017   BUN 21 (H) 09/16/2017   CREATININE 0.91 09/16/2017   PROT 7.1 09/14/2017   ALBUMIN 3.6 09/14/2017   BILITOT 0.4 09/14/2017   ALKPHOS 93 09/14/2017   AST 30 09/14/2017   ALT 14 (L) 09/14/2017  .   Total Discharge time is about 33 minutes  Roxan Hockey M.D on 09/17/2017 at  10:34 AM  [facility]   Office  [phone]  Voice Recognition Viviann Spare dictation system was used to create this note, attempts have been made to correct errors. Please contact the author with questions and/or clarifications.

## 2017-09-23 ENCOUNTER — Ambulatory Visit: Payer: Self-pay | Admitting: Neurology

## 2017-09-23 ENCOUNTER — Encounter

## 2017-12-06 ENCOUNTER — Ambulatory Visit: Payer: Self-pay | Admitting: Neurology

## 2018-01-26 ENCOUNTER — Encounter: Payer: Self-pay | Admitting: Neurology

## 2018-01-26 ENCOUNTER — Other Ambulatory Visit: Payer: Self-pay

## 2018-01-26 ENCOUNTER — Other Ambulatory Visit (INDEPENDENT_AMBULATORY_CARE_PROVIDER_SITE_OTHER): Payer: MEDICARE

## 2018-01-26 ENCOUNTER — Ambulatory Visit
Admission: RE | Admit: 2018-01-26 | Discharge: 2018-01-26 | Disposition: A | Discharge status: Home / Self Care | Payer: MEDICARE | Source: Ambulatory Visit | Attending: Neurology | Admitting: Neurology

## 2018-01-26 ENCOUNTER — Ambulatory Visit (INDEPENDENT_AMBULATORY_CARE_PROVIDER_SITE_OTHER): Payer: MEDICARE | Admitting: Neurology

## 2018-01-26 VITALS — BP 100/48 | HR 98 | Ht 66.0 in | Wt 94.0 lb

## 2018-01-26 DIAGNOSIS — F0390 Unspecified dementia without behavioral disturbance: Secondary | ICD-10-CM

## 2018-01-26 DIAGNOSIS — F03B0 Unspecified dementia, moderate, without behavioral disturbance, psychotic disturbance, mood disturbance, and anxiety: Secondary | ICD-10-CM

## 2018-01-26 DIAGNOSIS — G40009 Localization-related (focal) (partial) idiopathic epilepsy and epileptic syndromes with seizures of localized onset, not intractable, without status epilepticus: Secondary | ICD-10-CM

## 2018-01-26 NOTE — Patient Instructions (Addendum)
1. Schedule head CT without contrast  We have sent a referral to [facility] for your CT and they will call you directly to schedule your appt. They are located at [address]. If you need to contact them directly please call [phone].   2. Bloodwork for TSH, B12  Your provider requests that you have LABS drawn today.  We share a lab with [facility] - they are located in [address] (second floor) of this building.  Once you get there, please have a seat and the phlebotomist will call your name.  If you have waited more than 15 minutes, please advise the front desk  3. Continue Levetiracetam 1000mg  twice a day and Phenytoin 100mg  twice a day 4. Follow-up in 1 year, call for any changes  FALL PRECAUTIONS: Be cautious when walking. Scan the area for obstacles that may increase the risk of trips and falls. When getting up in the mornings, sit up at the edge of the bed for a few minutes before getting out of bed. Consider elevating the bed at the head end to avoid drop of blood pressure when getting up. Walk always in a well-lit room (use night lights in the walls). Avoid area rugs or power cords from appliances in the middle of the walkways. Use a walker or a cane if necessary and consider physical therapy for balance exercise. Get your eyesight checked regularly.  HOME SAFETY: Consider the safety of the kitchen when operating appliances like stoves, microwave oven, and blender. Consider having supervision and share cooking responsibilities until no longer able to participate in those. Accidents with firearms and other hazards in the house should be identified and addressed as well.  ABILITY TO BE LEFT ALONE: If patient is unable to contact 911 operator, consider using LifeLine, or when the need is there, arrange for someone to stay with patients. Smoking is a fire hazard, consider supervision or cessation. Risk of wandering should be assessed by caregiver and if detected at  any point, supervision and safe proof recommendations should be instituted.  MEDICATION SUPERVISION: Inability to self-administer medication needs to be constantly addressed. Implement a mechanism to ensure safe administration of the medications.  RECOMMENDATIONS FOR ALL PATIENTS WITH MEMORY PROBLEMS: 1. Continue to exercise (Recommend 30 minutes of walking everyday, or 3 hours every week) 2. Increase social interactions - continue going to [place] and enjoy social gatherings with friends and family 3. Eat healthy, avoid fried foods and eat more fruits and vegetables 4. Maintain adequate blood pressure, blood sugar, and blood cholesterol level. Reducing the risk of stroke and cardiovascular disease also helps promoting better memory. 5. Avoid stressful situations. Live a simple life and avoid aggravations. Organize your time and prepare for the next day in anticipation. 6. Sleep well, avoid any interruptions of sleep and avoid any distractions in the bedroom that may interfere with adequate sleep quality 7. Avoid sugar, avoid sweets as there is a strong link between excessive sugar intake, diabetes, and cognitive impairment The Mediterranean diet has been shown to help patients reduce the risk of progressive memory disorders and reduces cardiovascular risk. This includes eating fish, eat fruits and green leafy vegetables, nuts like almonds and hazelnuts, walnuts, and also use olive oil. Avoid fast foods and fried foods as much as possible. Avoid sweets and sugar as sugar use has been linked to worsening of memory function.  There is always a concern of gradual progression of memory problems. If this is the case, then we  may need to adjust level of care according to patient needs. Support, both to the patient and caregiver, should then be put into place.

## 2018-01-26 NOTE — Progress Notes (Signed)
NEUROLOGY CONSULTATION NOTE  Brent [name] MRN: [id number] DOB: 06/16/1926  Referring provider: Dr. Chase Picket Primary care provider: Dr. Chase Picket  Reason for consult:  Memory loss, seizures  Dear Dr Claudette Head:  Thank you for your kind referral of Brent [name] for consultation of the above symptoms. Although his history is well known to you, please allow me to reiterate it for the purpose of our medical record. The patient was accompanied to the clinic by his wife who also provides collateral information. Records and images were personally reviewed where available.  HISTORY OF PRESENT ILLNESS: This is a pleasant 84 year old right-handed man with a history of hypertension, hyperlipidemia, COPD on O2 via nasal cannula, presenting to establish care for seizures and memory loss. He is a poor historian, his wife is a poor historian as well but provides more information. He reports his memory is "sometimes off." His wife reports his memory was good until 3 months ago, he would keep asking what the date is, or say he took his medications but still see them in the pillbox. His daughter fills his pillbox, he continues to take them by himself but his wife checks behind him. His wife manages finances. He has not been driving since seizures started in 1993. He needs help with dressing and bathing and wears adult diapers for urinary incontinence. 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 He continued to have seizures even when sober. His wife describes both generalized convulsions, as well as confusional episodes where he would try to pick up the toilet and not know what he was doing for 15-20 minutes. She has not seen any seizures since Keppra dose was increased to 1000mg  BID around 5 years ago. He has been taking Dilantin 100mg  BID. He notices occasional mild dizziness after taking his medications. He denies any headaches, diplopia, neck/back pain, focal  numbness/tingling/weakness, bowel dysfunction. He occasionally notices difficulty swallowing. Sleep is good, no wandering behavior. No hallucinations or paranoia. He ambulates with a walker or holds on to walls, no falls. No family history of dementia. He had a normal birth and early development.  There is no history of febrile convulsions, CNS infections such as meningitis/encephalitis, significant traumatic brain injury, neurosurgical procedures, or family history of seizures  Diagnostic Data: Keppra level in December 2018 was 66.2, Dilantin level 15.9  PAST MEDICAL HISTORY: Past Medical History:  Diagnosis Date  . Asthma   . Cancer ([place])    cancer  . Emphysema of lung ([place])   . Seizures ([place])     PAST SURGICAL HISTORY: No past surgical history on file.  MEDICATIONS: Current Outpatient Medications on File Prior to Visit  Medication Sig Dispense Refill  . albuterol (PROVENTIL) (2.5 MG/3ML) 0.083% nebulizer solution Take 3 mLs (2.5 mg total) by nebulization every 2 (two) hours as needed for wheezing or shortness of breath. 75 mL 12  . albuterol (VENTOLIN HFA) 108 (90 Base) MCG/ACT inhaler Inhale 2 puffs into the lungs every 4 (four) hours as needed for wheezing or shortness of breath. 1 Inhaler 1  . aspirin EC 81 MG tablet Take 1 tablet (81 mg total) by mouth daily. With Breakfast 30 tablet 2  . Cholecalciferol (VITAMIN D3) 1000 units CAPS Take 1,000 Units by mouth daily at 12 noon.     . feeding supplement, ENSURE ENLIVE, (ENSURE ENLIVE) LIQD Take 237 mLs by mouth 3 (three) times daily between meals. 237 mL 12  . guaiFENesin (MUCINEX) 600 MG 12 hr tablet  Take 1 tablet (600 mg total) by mouth 2 (two) times daily. 20 tablet 2  . levETIRAcetam (KEPPRA) 1000 MG tablet Take 1 tablet (1,000 mg total) by mouth 2 (two) times daily. 60 tablet 5  . mirtazapine (REMERON) 7.5 MG tablet Take 1 tablet (7.5 mg total) by mouth at bedtime. For mood and appetite 30 tablet 2  . omeprazole (PRILOSEC) 20 MG  capsule Take 1 capsule (20 mg total) by mouth daily. 30 capsule 2  . phenytoin (DILANTIN) 100 MG ER capsule Take 1 capsule (100 mg total) by mouth 2 (two) times daily. 60 capsule 2  . tiotropium (SPIRIVA HANDIHALER) 18 MCG inhalation capsule Place 1 capsule (18 mcg total) into inhaler and inhale daily. 30 capsule 2   No current facility-administered medications on file prior to visit.     ALLERGIES: No Known Allergies  FAMILY HISTORY: No family history on file.  SOCIAL HISTORY: Social History   Socioeconomic History  . Marital status: Unknown    Spouse name: Not on file  . Number of children: Not on file  . Years of education: Not on file  . Highest education level: Not on file  Occupational History  . Not on file  Social Needs  . Financial resource strain: Not on file  . Food insecurity:    Worry: Not on file    Inability: Not on file  . Transportation needs:    Medical: Not on file    Non-medical: Not on file  Tobacco Use  . Smoking status: Former Research scientist (life sciences)  . Smokeless tobacco: Never Used  Substance and Sexual Activity  . Alcohol use: Not Currently  . Drug use: Never  . Sexual activity: Not Currently  Lifestyle  . Physical activity:    Days per week: Not on file    Minutes per session: Not on file  . Stress: Not on file  Relationships  . Social connections:    Talks on phone: Not on file    Gets together: Not on file    Attends religious service: Not on file    Active member of club or organization: Not on file    Attends meetings of clubs or organizations: Not on file    Relationship status: Not on file  . Intimate partner violence:    Fear of current or ex partner: Not on file    Emotionally abused: Not on file    Physically abused: Not on file    Forced sexual activity: Not on file  Other Topics Concern  . 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 [place]    REVIEW OF SYSTEMS: Constitutional: No fevers, chills, or sweats, no generalized fatigue, change in appetite Eyes: No visual changes, double vision, eye pain Ear, nose and throat: No hearing loss, ear pain, nasal congestion, sore throat Cardiovascular: No chest pain, palpitations Respiratory:  No shortness of breath at rest or with exertion, wheezes GastrointestinaI: No nausea, vomiting, diarrhea, abdominal pain, fecal incontinence Genitourinary:  No dysuria, urinary retention or frequency Musculoskeletal:  No neck pain, back pain Integumentary: No rash, pruritus, skin lesions Neurological: as above Psychiatric: No depression, insomnia, anxiety Endocrine: No palpitations, fatigue, diaphoresis, mood swings, change in appetite, change in weight, increased thirst Hematologic/Lymphatic:  No anemia, purpura, petechiae. Allergic/Immunologic: no itchy/runny eyes, nasal  congestion, recent allergic reactions, rashes  PHYSICAL EXAM: Vitals:   01/26/18 1025  BP: (!) 100/48  Pulse: 98  SpO2: 94%   General: No acute distress, sitting on a wheelchair Head:  Normocephalic/atraumatic, poor dentition Eyes: Fundoscopic exam shows bilateral sharp discs, no vessel changes, exudates, or hemorrhages Neck: supple, no paraspinal tenderness, full range of motion Back: No paraspinal tenderness Heart: regular rate and rhythm Lungs: Clear to auscultation bilaterally. Vascular: No carotid bruits. Skin/Extremities: No rash, +bipedal edema Neurological Exam: Mental status: alert and oriented to person, place, month/day/season, states he is 88, +mild dysarthria, no aphasia, he is slow to respond but answers appropriately. Fund of knowledge is appropriate.  Recent and remote memory are impaired.  Attention and concentration are reduced.    Able to name objects and repeat phrases. CDT 4/5 MMSE - Mini Mental State Exam 01/26/2018  Orientation to  time 3  Orientation to Place 5  Registration 3  Attention/ Calculation 1  Recall 0  Language- name 2 objects 2  Language- repeat 1  Language- follow 3 step command 3  Language- read & follow direction 1  Write a sentence 0  Copy design 0  Total score 19   Cranial nerves: CN I: not tested CN II: pupils equal, round and reactive to light, visual fields intact, fundi unremarkable. CN III, IV, VI:  full range of motion, no nystagmus, no ptosis CN V: facial sensation intact CN VII: upper and lower face symmetric CN VIII: hearing intact to finger rub CN IX, X: gag intact, uvula midline CN XI: sternocleidomastoid and trapezius muscles intact CN XII: tongue midline Bulk & Tone: normal, no fasciculations. Motor: 5/5 throughout with no pronator drift. Sensation: decreased cold on left UE and LE, intact pin, vibration and joint position sense.  No extinction to double simultaneous stimulation.  Deep Tendon Reflexes: unable to elicit throughout Plantar responses: downgoing bilaterally Cerebellar: no incoordination on finger to nose testing Gait: not tested, ambulates with walker at home, did not bring walker today Tremor: none  IMPRESSION: This is a pleasant 84 year old right-handed man with a history of hypertension, hyperlipidemia, COPD on O2 via nasal cannula, presenting to establish care for seizures and memory loss. He has not had any seizures in over 5 years since increase in Keppra to 1000mg  BID, he is also on Dilantin 100mg  BID. His Keppra level in December 2018 was elevated, however it is unclear if this was a trough level, and he is not having any side effects on the medication, would continue current doses at this time. His wife reports memory changes only starting 3 months ago, however records from prior visits indicate dementia. MMSE today 19/30 indicating moderate dementia without behavioral disturbance. Head CT without contrast will be ordered to assess for underlying structural  abnormality, check TSH and B12. We discussed medications for dementia such as Donepezil, including side effects and expectations, and have agreed to hold off on starting this. Continue 24/7 supervision, his wife was provided with resources Science writer) to hopefully provide more help at home. He does not drive. Follow-up in 1 year, they know to call for any changes.   Thank you for allowing me to participate in the care of this patient. Please do not hesitate to call for any questions or concerns.   Ellouise Newer, M.D.  CC: Dr. Claudette Head

## 2018-01-27 ENCOUNTER — Telehealth: Payer: Self-pay

## 2018-01-27 LAB — VITAMIN B12: Vitamin B-12: 283 pg/mL (ref 200–1100)

## 2018-01-27 LAB — TSH: TSH: 2.47 mIU/L (ref 0.40–4.50)

## 2018-01-27 NOTE — Telephone Encounter (Signed)
-----   Message from Cameron Sprang, MD sent at 01/27/2018 11:47 AM EDT ----- Pls let his niece know the brain scan looked fine, no evidence of tumor, stroke, or bleed. His thyroid level was normal, B12 level was low normal, recommend starting a daily vitamin B12 500 mcg supplement. Thanks

## 2018-01-27 NOTE — Telephone Encounter (Signed)
LMOM relaying message below.  

## 2018-06-14 ENCOUNTER — Inpatient Hospital Stay (HOSPITAL_COMMUNITY)
Admission: EM | Admit: 2018-06-14 | Discharge: 2018-06-16 | DRG: 190 | Disposition: A | Discharge status: Home Health | Payer: MEDICARE | Attending: Internal Medicine | Admitting: Internal Medicine

## 2018-06-14 ENCOUNTER — Encounter (HOSPITAL_COMMUNITY): Payer: Self-pay | Admitting: Emergency Medicine

## 2018-06-14 ENCOUNTER — Other Ambulatory Visit: Payer: Self-pay

## 2018-06-14 ENCOUNTER — Emergency Department (HOSPITAL_COMMUNITY): Payer: MEDICARE

## 2018-06-14 DIAGNOSIS — Z7982 Long term (current) use of aspirin: Secondary | ICD-10-CM

## 2018-06-14 DIAGNOSIS — J9621 Acute and chronic respiratory failure with hypoxia: Secondary | ICD-10-CM | POA: Diagnosis present

## 2018-06-14 DIAGNOSIS — Z9981 Dependence on supplemental oxygen: Secondary | ICD-10-CM

## 2018-06-14 DIAGNOSIS — E43 Unspecified severe protein-calorie malnutrition: Secondary | ICD-10-CM | POA: Diagnosis present

## 2018-06-14 DIAGNOSIS — J9622 Acute and chronic respiratory failure with hypercapnia: Secondary | ICD-10-CM | POA: Diagnosis present

## 2018-06-14 DIAGNOSIS — E785 Hyperlipidemia, unspecified: Secondary | ICD-10-CM | POA: Diagnosis present

## 2018-06-14 DIAGNOSIS — J9602 Acute respiratory failure with hypercapnia: Secondary | ICD-10-CM

## 2018-06-14 DIAGNOSIS — G40909 Epilepsy, unspecified, not intractable, without status epilepticus: Secondary | ICD-10-CM | POA: Diagnosis present

## 2018-06-14 DIAGNOSIS — J9601 Acute respiratory failure with hypoxia: Secondary | ICD-10-CM | POA: Diagnosis present

## 2018-06-14 DIAGNOSIS — J441 Chronic obstructive pulmonary disease with (acute) exacerbation: Secondary | ICD-10-CM | POA: Diagnosis present

## 2018-06-14 DIAGNOSIS — E872 Acidosis: Secondary | ICD-10-CM | POA: Diagnosis present

## 2018-06-14 DIAGNOSIS — Z66 Do not resuscitate: Secondary | ICD-10-CM | POA: Diagnosis present

## 2018-06-14 DIAGNOSIS — Z87891 Personal history of nicotine dependence: Secondary | ICD-10-CM

## 2018-06-14 DIAGNOSIS — J439 Emphysema, unspecified: Secondary | ICD-10-CM | POA: Diagnosis present

## 2018-06-14 DIAGNOSIS — Z681 Body mass index (BMI) 19 or less, adult: Secondary | ICD-10-CM | POA: Diagnosis not present

## 2018-06-14 DIAGNOSIS — Z79899 Other long term (current) drug therapy: Secondary | ICD-10-CM

## 2018-06-14 DIAGNOSIS — R569 Unspecified convulsions: Secondary | ICD-10-CM

## 2018-06-14 DIAGNOSIS — R64 Cachexia: Secondary | ICD-10-CM | POA: Diagnosis present

## 2018-06-14 DIAGNOSIS — D61818 Other pancytopenia: Secondary | ICD-10-CM | POA: Diagnosis present

## 2018-06-14 DIAGNOSIS — D72819 Decreased white blood cell count, unspecified: Secondary | ICD-10-CM | POA: Diagnosis present

## 2018-06-14 DIAGNOSIS — F0390 Unspecified dementia without behavioral disturbance: Secondary | ICD-10-CM | POA: Diagnosis present

## 2018-06-14 LAB — BLOOD GAS, ARTERIAL
Acid-Base Excess: 15.3 mmol/L — ABNORMAL HIGH (ref 0.0–2.0)
BICARBONATE: 43.2 mmol/L — AB (ref 20.0–28.0)
Delivery systems: POSITIVE
Drawn by: 301361
EXPIRATORY PAP: 7
FIO2: 100
Inspiratory PAP: 14
O2 Saturation: 97.1 %
Patient temperature: 98
RATE: 10 resp/min
pCO2 arterial: 105 mmHg (ref 32.0–48.0)
pH, Arterial: 7.237 — ABNORMAL LOW (ref 7.350–7.450)
pO2, Arterial: 105 mmHg (ref 83.0–108.0)

## 2018-06-14 LAB — COMPREHENSIVE METABOLIC PANEL
ALT: 12 U/L (ref 0–44)
AST: 25 U/L (ref 15–41)
Albumin: 3.7 g/dL (ref 3.5–5.0)
Alkaline Phosphatase: 88 U/L (ref 38–126)
Anion gap: 8 (ref 5–15)
BUN: 14 mg/dL (ref 8–23)
CO2: 45 mmol/L — ABNORMAL HIGH (ref 22–32)
Calcium: 9.5 mg/dL (ref 8.9–10.3)
Chloride: 85 mmol/L — ABNORMAL LOW (ref 98–111)
Creatinine, Ser: 0.67 mg/dL (ref 0.61–1.24)
GFR calc Af Amer: >60 mL/min (ref >60)
Glucose, Bld: 144 mg/dL — ABNORMAL HIGH (ref 70–99)
Potassium: 4.9 mmol/L (ref 3.5–5.1)
Sodium: 138 mmol/L (ref 135–145)
Total Bilirubin: 0.4 mg/dL (ref 0.3–1.2)
Total Protein: 7.4 g/dL (ref 6.5–8.1)

## 2018-06-14 LAB — CBC WITH DIFFERENTIAL/PLATELET
Abs Immature Granulocytes: 0 10*3/uL (ref 0.00–0.07)
BASOS ABS: 0 10*3/uL (ref 0.0–0.1)
Basophils Relative: 0 %
Eosinophils Absolute: 0.1 10*3/uL (ref 0.0–0.5)
Eosinophils Relative: 3 %
HEMATOCRIT: 40.9 % (ref 39.0–52.0)
Hemoglobin: 12 g/dL — ABNORMAL LOW (ref 13.0–17.0)
IMMATURE GRANULOCYTES: 0 %
LYMPHS ABS: 0.6 10*3/uL — AB (ref 0.7–4.0)
Lymphocytes Relative: 15 %
MCH: 28.7 pg (ref 26.0–34.0)
MCHC: 29.3 g/dL — ABNORMAL LOW (ref 30.0–36.0)
MCV: 97.8 fL (ref 80.0–100.0)
Monocytes Absolute: 0.4 10*3/uL (ref 0.1–1.0)
Monocytes Relative: 11 %
Neutro Abs: 2.8 10*3/uL (ref 1.7–7.7)
Neutrophils Relative %: 71 %
Platelets: 134 10*3/uL — ABNORMAL LOW (ref 150–400)
RBC: 4.18 MIL/uL — ABNORMAL LOW (ref 4.22–5.81)
RDW: 11.9 % (ref 11.5–15.5)
WBC: 3.9 10*3/uL — ABNORMAL LOW (ref 4.0–10.5)
nRBC: 0 % (ref 0.0–0.2)

## 2018-06-14 LAB — I-STAT TROPONIN, ED: Troponin i, poc: 0.01 ng/mL (ref 0.00–0.08)

## 2018-06-14 LAB — PHOSPHORUS: Phosphorus: 2.5 mg/dL (ref 2.5–4.6)

## 2018-06-14 LAB — MRSA PCR SCREENING: MRSA by PCR: NEGATIVE

## 2018-06-14 LAB — PROCALCITONIN: Procalcitonin: <0.1 ng/mL

## 2018-06-14 LAB — INFLUENZA PANEL BY PCR (TYPE A & B)
Influenza A By PCR: NEGATIVE
Influenza B By PCR: NEGATIVE

## 2018-06-14 LAB — TROPONIN I: Troponin I: <0.03 ng/mL (ref <0.03)

## 2018-06-14 LAB — MAGNESIUM: Magnesium: 1.7 mg/dL (ref 1.7–2.4)

## 2018-06-14 LAB — LACTIC ACID, PLASMA: LACTIC ACID, VENOUS: 1 mmol/L (ref 0.5–1.9)

## 2018-06-14 MED ORDER — PHENYTOIN SODIUM EXTENDED 100 MG PO CAPS
100.0000 mg | ORAL_CAPSULE | Freq: Two times a day (BID) | ORAL | Status: DC
  Administered 2018-06-15 – 2018-06-16 (×3): 100 mg via ORAL
  Filled 2018-06-14 (×3): qty 1

## 2018-06-14 MED ORDER — SODIUM CHLORIDE 0.9 % IV SOLN
1.0000 g | Freq: Once | INTRAVENOUS | Status: AC
  Administered 2018-06-14: 1 g via INTRAVENOUS
  Filled 2018-06-14: qty 10

## 2018-06-14 MED ORDER — SODIUM CHLORIDE 0.9 % IV BOLUS (SEPSIS)
1000.0000 mL | Freq: Once | INTRAVENOUS | Status: AC
  Administered 2018-06-14: 1000 mL via INTRAVENOUS

## 2018-06-14 MED ORDER — PANTOPRAZOLE SODIUM 40 MG PO TBEC
40.0000 mg | DELAYED_RELEASE_TABLET | Freq: Every day | ORAL | Status: DC
  Administered 2018-06-15 – 2018-06-16 (×2): 40 mg via ORAL
  Filled 2018-06-14 (×2): qty 1

## 2018-06-14 MED ORDER — SODIUM CHLORIDE 0.9 % IV BOLUS (SEPSIS)
500.0000 mL | Freq: Once | INTRAVENOUS | Status: AC
  Administered 2018-06-14: 500 mL via INTRAVENOUS

## 2018-06-14 MED ORDER — SODIUM CHLORIDE 0.9 % IV SOLN
500.0000 mg | Freq: Once | INTRAVENOUS | Status: AC
  Administered 2018-06-14: 500 mg via INTRAVENOUS
  Filled 2018-06-14: qty 500

## 2018-06-14 MED ORDER — ASPIRIN EC 81 MG PO TBEC
81.0000 mg | DELAYED_RELEASE_TABLET | Freq: Every day | ORAL | Status: DC
  Administered 2018-06-15 – 2018-06-16 (×2): 81 mg via ORAL
  Filled 2018-06-14 (×2): qty 1

## 2018-06-14 MED ORDER — PREDNISONE 20 MG PO TABS
40.0000 mg | ORAL_TABLET | Freq: Every day | ORAL | Status: DC
  Administered 2018-06-15 – 2018-06-16 (×2): 40 mg via ORAL
  Filled 2018-06-14 (×2): qty 2

## 2018-06-14 MED ORDER — METHYLPREDNISOLONE SODIUM SUCC 125 MG IJ SOLR
125.0000 mg | Freq: Once | INTRAMUSCULAR | Status: AC
  Administered 2018-06-14: 125 mg via INTRAVENOUS
  Filled 2018-06-14: qty 2

## 2018-06-14 MED ORDER — IPRATROPIUM-ALBUTEROL 0.5-2.5 (3) MG/3ML IN SOLN
3.0000 mL | RESPIRATORY_TRACT | Status: AC
  Administered 2018-06-14 (×3): 3 mL via RESPIRATORY_TRACT
  Filled 2018-06-14: qty 6
  Filled 2018-06-14: qty 3

## 2018-06-14 MED ORDER — LEVETIRACETAM 500 MG PO TABS
1000.0000 mg | ORAL_TABLET | Freq: Two times a day (BID) | ORAL | Status: DC
  Administered 2018-06-15 – 2018-06-16 (×3): 1000 mg via ORAL
  Filled 2018-06-14 (×3): qty 2

## 2018-06-14 MED ORDER — IPRATROPIUM-ALBUTEROL 0.5-2.5 (3) MG/3ML IN SOLN
3.0000 mL | Freq: Four times a day (QID) | RESPIRATORY_TRACT | Status: AC
  Administered 2018-06-14 – 2018-06-15 (×3): 3 mL via RESPIRATORY_TRACT
  Filled 2018-06-14 (×3): qty 3

## 2018-06-14 NOTE — ED Provider Notes (Signed)
Brent [name] Provider Note   CSN: [id number] Arrival date & time: 06/14/18  6144    History   Chief Complaint Chief Complaint  Patient presents with  . Code Sepsis    HPI Brent [name] is a 85 y.o. male.     85 yo M with a chief complaint of weakness.  This was noted by the family this morning.  They called 911 because he was unable to get up and walk.  Upon EMS arrival they found the patient to be warm to the touch and had a temperature of 102.  He is normally on 2 L of oxygen and was in the mid 80s on his oxygen percentage.  Was placed on a nonrebreather in route.  Was noted to be somewhat tachypneic.  Per EMS the patient denied any recent illness.  They gave him Tylenol in route but that he had coughed most of it.  The patient is able to respond to me but seems somewhat confused, states that he has been coughing for some time.  Brent [name] he is been sick for a while.  Not able to accurately discuss how long he has been ill.  Denies vomiting or diarrhea.  Level 5 caveat altered mental status.  The history is provided by the patient.  Illness  Severity:  Severe Onset quality:  Sudden Duration:  1 day Timing:  Constant Progression:  Worsening Chronicity:  New Associated symptoms: congestion, cough, fatigue and fever   Associated symptoms: no abdominal pain, no chest pain, no diarrhea, no headaches, no myalgias, no rash, no shortness of breath and no vomiting     Past Medical History:  Diagnosis Date  . Asthma   . Cancer ([place])    cancer  . Emphysema of lung ([place])   . Seizures [facility])     Patient Active Problem List   Diagnosis Date Noted  . COPD exacerbation ([place]) 06/14/2018  . Protein-calorie malnutrition, severe 09/15/2017  . Acute respiratory failure with hypoxia ([place]) 09/14/2017  . Recurrent seizures ([place]) 07/06/2017  . Hypertension 07/06/2017  . Hyperlipidemia, unspecified 07/06/2017  . COPD (chronic obstructive pulmonary disease)  ([place]) 07/06/2017  . Chronic dementia, without behavioral disturbance ([place]) 09/11/2014  . Failure to thrive in adult 03/09/2014    History reviewed. No pertinent surgical history.      Home Medications    Prior to Admission medications   Medication Sig Start Date End Date Taking? Authorizing Provider  albuterol (PROVENTIL) (2.5 MG/3ML) 0.083% nebulizer solution Take 3 mLs (2.5 mg total) by nebulization every 2 (two) hours as needed for wheezing or shortness of breath. 09/17/17  Yes Emokpae, Courage, MD  albuterol (VENTOLIN HFA) 108 (90 Base) MCG/ACT inhaler Inhale 2 puffs into the lungs every 4 (four) hours as needed for wheezing or shortness of breath. 09/17/17  Yes Emokpae, Courage, MD  aspirin EC 81 MG tablet Take 1 tablet (81 mg total) by mouth daily. With Breakfast 09/17/17  Yes Emokpae, Courage, MD  Cholecalciferol (VITAMIN D3) 1000 units CAPS Take 1,000 Units by mouth daily at 12 noon.    Yes [provider]  feeding supplement, ENSURE ENLIVE, (ENSURE ENLIVE) LIQD Take 237 mLs by mouth 3 (three) times daily between meals. 09/17/17  Yes Emokpae, Courage, MD  furosemide (LASIX) 20 MG tablet Take 20 mg by mouth daily as needed for edema.   Yes [provider]  guaiFENesin (MUCINEX) 600 MG 12 hr tablet Take 1 tablet (600 mg total) by mouth 2 (two)  times daily. Patient taking differently: Take 600 mg by mouth 2 (two) times daily as needed for cough or to loosen phlegm.  09/17/17  Yes Emokpae, Courage, MD  levETIRAcetam (KEPPRA) 1000 MG tablet Take 1 tablet (1,000 mg total) by mouth 2 (two) times daily. 09/17/17 06/14/18 Yes Emokpae, Courage, MD  mirtazapine (REMERON) 7.5 MG tablet Take 1 tablet (7.5 mg total) by mouth at bedtime. For mood and appetite Patient taking differently: Take 7.5 mg by mouth every other day. For mood and appetite 09/17/17  Yes Emokpae, Courage, MD  omeprazole (PRILOSEC) 20 MG capsule Take 1 capsule (20 mg total) by mouth daily. 09/17/17 09/17/18 Yes Emokpae,  Courage, MD  phenytoin (DILANTIN) 100 MG ER capsule Take 1 capsule (100 mg total) by mouth 2 (two) times daily. 09/17/17  Yes Emokpae, Courage, MD  tiotropium (SPIRIVA HANDIHALER) 18 MCG inhalation capsule Place 1 capsule (18 mcg total) into inhaler and inhale daily. Patient not taking: Reported on 06/14/2018 09/17/17 09/17/18  Roxan Hockey, MD    Family History History reviewed. No pertinent family history.  Social History Social History   Tobacco Use  . Smoking status: Former Research scientist (life sciences)  . Smokeless tobacco: Never Used  Substance Use Topics  . Alcohol use: Not Currently  . Drug use: Never     Allergies   Patient has no known allergies.   Review of Systems Review of Systems  Constitutional: Positive for fatigue and fever. Negative for chills.  HENT: Positive for congestion. Negative for facial swelling.   Eyes: Negative for discharge and visual disturbance.  Respiratory: Positive for cough. Negative for shortness of breath.   Cardiovascular: Negative for chest pain and palpitations.  Gastrointestinal: Negative for abdominal pain, diarrhea and vomiting.  Musculoskeletal: Negative for arthralgias and myalgias.  Skin: Negative for color change and rash.  Neurological: Negative for tremors, syncope and headaches.  Psychiatric/Behavioral: Negative for confusion and dysphoric mood.     Physical Exam Updated Vital Signs BP (!) 142/67   Pulse 99   Temp 98 F (36.7 C) (Oral)   Resp 16   Ht 5\' 6"  (1.676 m)   Wt 44.5 kg   SpO2 95%   BMI 15.85 kg/m   Physical Exam Vitals signs and nursing note reviewed.  Constitutional:      Appearance: He is well-developed.  HENT:     Head: Normocephalic and atraumatic.     Comments: Swollen turbinates, posterior nasal drip, no noted sinus ttp, tm normal bilaterally.   Eyes:     Pupils: Pupils are equal, round, and reactive to light.  Neck:     Musculoskeletal: Normal range of motion and neck supple.     Vascular: No JVD.   Cardiovascular:     Rate and Rhythm: Normal rate and regular rhythm.     Heart sounds: No murmur. No friction rub. No gallop.   Pulmonary:     Effort: No respiratory distress.     Breath sounds: No wheezing.     Comments: Coarse breath sounds in all fields Abdominal:     General: There is no distension.     Tenderness: There is no guarding or rebound.  Musculoskeletal: Normal range of motion.  Skin:    Coloration: Skin is not pale.     Findings: No rash.  Neurological:     Mental Status: He is alert.     Comments: Somewhat confused to questioning  Psychiatric:        Behavior: Behavior normal.      ED  Treatments / Results  Labs (all labs ordered are listed, but only abnormal results are displayed) Labs Reviewed  COMPREHENSIVE METABOLIC PANEL - Abnormal; Notable for the following components:      Result Value   Chloride 85 (*)    CO2 45 (*)    Glucose, Bld 144 (*)    All other components within normal limits  CBC WITH DIFFERENTIAL/PLATELET - Abnormal; Notable for the following components:   WBC 3.9 (*)    RBC 4.18 (*)    Hemoglobin 12.0 (*)    MCHC 29.3 (*)    Platelets 134 (*)    Lymphs Abs 0.6 (*)    All other components within normal limits  BLOOD GAS, ARTERIAL - Abnormal; Notable for the following components:   pH, Arterial 7.237 (*)    pCO2 arterial 105 (*)    Bicarbonate 43.2 (*)    Acid-Base Excess 15.3 (*)    All other components within normal limits  CULTURE, BLOOD (ROUTINE X 2)  CULTURE, BLOOD (ROUTINE X 2)  URINE CULTURE  LACTIC ACID, PLASMA  INFLUENZA PANEL BY PCR (TYPE A & B)  TROPONIN I  MAGNESIUM  PHOSPHORUS  URINALYSIS, ROUTINE W REFLEX MICROSCOPIC  PROCALCITONIN  I-STAT TROPONIN, ED    EKG EKG Interpretation  Date/Time:  Tuesday June 14 2018 10:25:59 EST Ventricular Rate:  97 PR Interval:    QRS Duration: 81 QT Interval:  396 QTC Calculation: 504 R Axis:   74 Text Interpretation:  Sinus rhythm Probable left atrial enlargement  Prolonged QT interval No significant change since last tracing Confirmed by Deno Etienne [phone]) on 06/14/2018 11:08:55 AM   Radiology Dg Chest Port 1 View  Result Date: 06/14/2018 CLINICAL DATA:  This morning woke up unable to get out of bed or even sit up, coughing, SOB, and Fever.Hx HTN, COPD, seizures, and Prostate cancer with radiation. EXAM: PORTABLE CHEST 1 VIEW COMPARISON:  09/14/2017 FINDINGS: Cardiac silhouette is top-normal in size. No mediastinal or hilar masses. No evidence of adenopathy. Lungs are hyperexpanded. There is a paucity of vascular markings in the mid to upper lungs consistent with emphysema. Mild interstitial thickening noted in the lower lungs. These findings are stable. No evidence of pneumonia or pulmonary edema. No pleural effusion or pneumothorax. Skeletal structures are grossly intact. IMPRESSION: 1. No acute cardiopulmonary disease. 2. Advanced emphysema/COPD.  Stable appearance from the prior study. Electronically Signed   By: Lajean Manes M.D.   On: 06/14/2018 09:55    Procedures Procedures (including critical care time)  Medications Ordered in ED Medications  ipratropium-albuterol (DUONEB) 0.5-2.5 (3) MG/3ML nebulizer solution 3 mL (3 mLs Nebulization Given 06/14/18 1512)  predniSONE (DELTASONE) tablet 40 mg (has no administration in time range)  levETIRAcetam (KEPPRA) tablet 1,000 mg (1,000 mg Oral Not Given 06/14/18 1441)  phenytoin (DILANTIN) ER capsule 100 mg (100 mg Oral Not Given 06/14/18 1441)  pantoprazole (PROTONIX) EC tablet 40 mg (40 mg Oral Not Given 06/14/18 1441)  aspirin EC tablet 81 mg (has no administration in time range)  sodium chloride 0.9 % bolus 1,000 mL (0 mLs Intravenous Stopped 06/14/18 1020)    And  sodium chloride 0.9 % bolus 500 mL (0 mLs Intravenous Stopped 06/14/18 1042)  cefTRIAXone (ROCEPHIN) 1 g in sodium chloride 0.9 % 100 mL IVPB (0 g Intravenous Stopped 06/14/18 1021)  azithromycin (ZITHROMAX) 500 mg in sodium chloride 0.9 % 250  mL IVPB (0 mg Intravenous Stopped 06/14/18 1100)  ipratropium-albuterol (DUONEB) 0.5-2.5 (3) MG/3ML nebulizer solution 3 mL (3  mLs Nebulization Given 06/14/18 1040)  methylPREDNISolone sodium succinate (SOLU-MEDROL) 125 mg/2 mL injection 125 mg (125 mg Intravenous Given 06/14/18 1221)     Initial Impression / Assessment and Plan / ED Course  I have reviewed the triage vital signs and the nursing notes.  Pertinent labs & imaging results that were available during my care of the patient were reviewed by me and considered in my medical decision making (see chart for details).        85 yo M with a chief complaint of weakness.  Patient likely infected with a temperature of 102 by EMS, he is mildly tachycardic on arrival blood pressure is in the 010X systolic.  Code sepsis was initiated due to confusion and tachypnea we will give her Rocephin and azithromycin for presumptive community-acquired pneumonia.  The patient's VBG is concerning for hypercarbia, this was confirmed on ABG.  He was placed on BiPAP.  Chest x-ray viewed by me without focal infiltrate.  Given duo nebs without improvement per the patient though he is mentating much better on my reassessment.  I discussed with him the possibility of him worsening and requiring intubation and potentially CPR and at this point he is a DNR.  Verified with him and his wife at bedside.  Will discuss with admitting team.  CRITICAL CARE Performed by: Cecilio Asper   Total critical care time: 80 minutes  Critical care time was exclusive of separately billable procedures and treating other patients.  Critical care was necessary to treat or prevent imminent or life-threatening deterioration.  Critical care was time spent personally by me on the following activities: development of treatment plan with patient and/or surrogate as well as nursing, discussions with consultants, evaluation of patient's response to treatment, examination of patient,  obtaining history from patient or surrogate, ordering and performing treatments and interventions, ordering and review of laboratory studies, ordering and review of radiographic studies, pulse oximetry and re-evaluation of patient's condition.  The patients results and plan were reviewed and discussed.   Any x-rays performed were independently reviewed by myself.   Differential diagnosis were considered with the presenting HPI.  Medications  ipratropium-albuterol (DUONEB) 0.5-2.5 (3) MG/3ML nebulizer solution 3 mL (3 mLs Nebulization Given 06/14/18 1512)  predniSONE (DELTASONE) tablet 40 mg (has no administration in time range)  levETIRAcetam (KEPPRA) tablet 1,000 mg (1,000 mg Oral Not Given 06/14/18 1441)  phenytoin (DILANTIN) ER capsule 100 mg (100 mg Oral Not Given 06/14/18 1441)  pantoprazole (PROTONIX) EC tablet 40 mg (40 mg Oral Not Given 06/14/18 1441)  aspirin EC tablet 81 mg (has no administration in time range)  sodium chloride 0.9 % bolus 1,000 mL (0 mLs Intravenous Stopped 06/14/18 1020)    And  sodium chloride 0.9 % bolus 500 mL (0 mLs Intravenous Stopped 06/14/18 1042)  cefTRIAXone (ROCEPHIN) 1 g in sodium chloride 0.9 % 100 mL IVPB (0 g Intravenous Stopped 06/14/18 1021)  azithromycin (ZITHROMAX) 500 mg in sodium chloride 0.9 % 250 mL IVPB (0 mg Intravenous Stopped 06/14/18 1100)  ipratropium-albuterol (DUONEB) 0.5-2.5 (3) MG/3ML nebulizer solution 3 mL (3 mLs Nebulization Given 06/14/18 1040)  methylPREDNISolone sodium succinate (SOLU-MEDROL) 125 mg/2 mL injection 125 mg (125 mg Intravenous Given 06/14/18 1221)    Vitals:   06/14/18 1415 06/14/18 1430 06/14/18 1445 06/14/18 1514  BP: (!) 124/59 131/60 (!) 142/67   Pulse: 93 94 99   Resp: 19 17 16    Temp:      TempSrc:      SpO2: 100%  100% 96% 95%  Weight:      Height:        Final diagnoses:  COPD exacerbation ([place])  Acute on chronic respiratory failure with hypercapnia ([place])    Admission/ observation were discussed with  the admitting physician, patient and/or family and they are comfortable with the plan.    Final Clinical Impressions(s) / ED Diagnoses   Final diagnoses:  COPD exacerbation ([place])  Acute on chronic respiratory failure with hypercapnia [facility])    ED Discharge Orders    None       Deno Etienne, DO 06/14/18 1536

## 2018-06-14 NOTE — ED Triage Notes (Signed)
Pt arrives EMS from home with generalized weakness, fever to 102 at home. RR 28. 640mg  tylenol PTA. Breathing labored. Wears 02 at 2L at baseline. Pt lethargic, unable to assess orientation. EKG ST, 130/60, 110, cbg 129, 102.7 temporal

## 2018-06-14 NOTE — H&P (Addendum)
Date: 06/14/2018               Patient Name:  Brent [name] MRN: [id number]  DOB: [birth year] Age / Sex: 85 y.o., male   PCP: Brent Picket, MD         Medical Service: Internal Medicine Teaching Service         Attending Physician: Dr. Rebeca Alert Raynaldo Opitz, MD    First Contact: Dr. Donne [name]  Pager: [phone]  Second Contact: Dr. Trilby [name] Pager: [phone]       After Hours (After 5p/  First Contact Pager: [phone]  weekends / holidays): Second Contact Pager: [phone]   Chief Complaint: generalized weakness and fever   History of Present Illness:  85 year old man with history of COPD with chronic hypoxic respiratory failure on 2 L home oxygen( FEV1 30% 2012), seizure, hyperlipidemia, dementia, hx of prostate surgery and tobacco use. EMS was called to his home this morning at 8 am by his wife who said that he was found generally weak and unable to get out of bed. He was noticed to be hot to touch, tachypneaic with a temp of 102.7 and too weak to ambulate. Code sepsis was called in the field. EKG showed sinus tachycardia. He was given tylenol. Pulse ox was difficult to read placed on a non rebreather.    In the ED he was given empiric ceftriaxone and azithromycin for presumed pneumonia. BMP was remarkable for a bicarb of 45. ABG showed pH 7.23, CO2 105, bicarb 43 and for the increased work of breathing he was placed on Bipap. CBC showed a leukocyte count of 3.9, hemoglobin of 40 with normal MCV and RDW and plt count 134. Troponin was 0.01. Lactic acid was 1, Influenza PCR were negative. Blood cultures were drawn. Chest Xray showed hyperinflation without any acute infiltrate.   Brent [name] says that he has had a cough productive of thick sputum for the past two weeks, he endorse asociated wheezing, chills, dyspnea, weakness and fatigue. He mentions that he tried using his albuterol at home but this did not help his symptoms. He denies sick contacts. He denies chest pain.    Meds:  Current Meds   Medication Sig  . albuterol (PROVENTIL) (2.5 MG/3ML) 0.083% nebulizer solution Take 3 mLs (2.5 mg total) by nebulization every 2 (two) hours as needed for wheezing or shortness of breath.  Marland Kitchen albuterol (VENTOLIN HFA) 108 (90 Base) MCG/ACT inhaler Inhale 2 puffs into the lungs every 4 (four) hours as needed for wheezing or shortness of breath.  Marland Kitchen aspirin EC 81 MG tablet Take 1 tablet (81 mg total) by mouth daily. With Breakfast  . Cholecalciferol (VITAMIN D3) 1000 units CAPS Take 1,000 Units by mouth daily at 12 noon.   . feeding supplement, ENSURE ENLIVE, (ENSURE ENLIVE) LIQD Take 237 mLs by mouth 3 (three) times daily between meals.  . furosemide (LASIX) 20 MG tablet Take 20 mg by mouth daily as needed for edema.  Marland Kitchen guaiFENesin (MUCINEX) 600 MG 12 hr tablet Take 1 tablet (600 mg total) by mouth 2 (two) times daily. (Patient taking differently: Take 600 mg by mouth 2 (two) times daily as needed for cough or to loosen phlegm. )  . levETIRAcetam (KEPPRA) 1000 MG tablet Take 1 tablet (1,000 mg total) by mouth 2 (two) times daily.  . mirtazapine (REMERON) 7.5 MG tablet Take 1 tablet (7.5 mg total) by mouth at bedtime. For mood and appetite (Patient taking differently: Take 7.5 mg by  mouth every other day. For mood and appetite)  . omeprazole (PRILOSEC) 20 MG capsule Take 1 capsule (20 mg total) by mouth daily.  . phenytoin (DILANTIN) 100 MG ER capsule Take 1 capsule (100 mg total) by mouth 2 (two) times daily.   Allergies: Allergies as of 06/14/2018  . (No Known Allergies)   Past Medical History:  Diagnosis Date  . Asthma   . Cancer ([place])    cancer  . Emphysema of lung ([place])   . Seizures ([place])     Family History: History reviewed. No pertinent family history.  Social History:  Brent [name] lives at home with his wife, he has a cousin that lives close by. He does not drink alcohol or smoke cigarettes but he did smoke in the past.   Review of Systems: A complete ROS was negative except as  per HPI.   Physical Exam: Blood pressure (!) 142/67, pulse 99, temperature 98 F (36.7 C), temperature source Oral, resp. rate 16, height 5\' 6"  (1.676 m), weight 44.8 kg, SpO2 95 %. General: chronically ill appearing, not in acute distress, cachectic  Cardiac: regular rate and rhythm, no murmurs or gallops, trace edema in bl heels  Pulm: on the Bipap, appears to have normal work of breathing, breath sounds are distant, there is no wheezing, there are bibasilar rhonchi heard especially over the right lower lung field, he is coughing up purulent sputum  Abdomen: scaphoid abdomen, soft, non tender, non distended  Extremities: warm, normal in color, no calf tenderness or swelling, there is edema in bl heels  Neuro: awake and alert, oriented to person and place, moving both the upper and lower extremities  Skin: no obvious rashes or skin breakdown  EKG: personally reviewed my interpretation is sinus rhythm, no acute ST segment changes  CXR: personally reviewed my interpretation is hyperinflation, no acute intrapulmonary abnormality   Assessment & Plan by Problem: Principal Problem:   COPD exacerbation (HCC) Active Problems:   Other pancytopenia (HCC)   Seizures (HCC)  Exacerbation of COPD  85 year old man with history of COPD presents with weakness and fatigue with initial temp recorded by EMS 102.7. This was proceeded by two weeks of cough with productive sputum associated with dyspnea. He has been afebrile since arrival and chest Xray was reassuring against pneumonia. Flu A and B were negative. ABG was concerning for hypercarbic respiratory acidosis and because of his increased work of breathing he was placed on Bipap. Bipap has worked to improve his work of breathing and mental status. He received ceftriaxone and azithromycin in the ED. Presentation seems consistent with an exacerbation  - continue Bipap therapy, admit to stepdown monitoring  - start prednisone 40 mg daily, he received IV  solumedrol today but will hopefully be off of bipap tomorrow and able to tolerate transition to prednisone    - continue ceftriaxone and azithromycin  - obtain procalcitonin  - scheduled duoneb   - follow up blood cultures   Seizure disorder  - continue home phenytoin and levetiracetam   Pancytopenia  History of leukopenia and thrombocytopenia in the past, anemia is new. Hemoglobin 12 today, down from 13 when last checked in may 2019. He has a normal MCV and RDW and no signs of active bleeding. Plt count 134 today, stable at baseline.  He has had platelet clumps in the past so we could consider drawing the plt count in a citrated tube if it becomes very low.  - follow up repeat CBC tomorrow,  will continue with further workup if the pancytopenia is worsening in severity or fails to improve with improvement in his clinical status   Dispo: Admit patient to Inpatient with expected length of stay greater than 2 midnights.  Signed: Ledell Noss, MD 06/14/2018, 5:07 PM  Pager: [phone]

## 2018-06-15 DIAGNOSIS — J441 Chronic obstructive pulmonary disease with (acute) exacerbation: Secondary | ICD-10-CM

## 2018-06-15 DIAGNOSIS — J9622 Acute and chronic respiratory failure with hypercapnia: Secondary | ICD-10-CM

## 2018-06-15 DIAGNOSIS — J9601 Acute respiratory failure with hypoxia: Secondary | ICD-10-CM | POA: Diagnosis present

## 2018-06-15 DIAGNOSIS — J9602 Acute respiratory failure with hypercapnia: Secondary | ICD-10-CM

## 2018-06-15 LAB — CBC WITH DIFFERENTIAL/PLATELET
Abs Immature Granulocytes: 0 10*3/uL (ref 0.00–0.07)
Basophils Absolute: 0 10*3/uL (ref 0.0–0.1)
Basophils Relative: 0 %
Eosinophils Absolute: 0.1 10*3/uL (ref 0.0–0.5)
Eosinophils Relative: 2 %
HEMATOCRIT: 34.4 % — AB (ref 39.0–52.0)
Hemoglobin: 10.4 g/dL — ABNORMAL LOW (ref 13.0–17.0)
Immature Granulocytes: 0 %
LYMPHS ABS: 0.8 10*3/uL (ref 0.7–4.0)
LYMPHS PCT: 27 %
MCH: 28.5 pg (ref 26.0–34.0)
MCHC: 30.2 g/dL (ref 30.0–36.0)
MCV: 94.2 fL (ref 80.0–100.0)
Monocytes Absolute: 0.5 10*3/uL (ref 0.1–1.0)
Monocytes Relative: 17 %
Neutro Abs: 1.5 10*3/uL — ABNORMAL LOW (ref 1.7–7.7)
Neutrophils Relative %: 54 %
Platelets: 110 10*3/uL — ABNORMAL LOW (ref 150–400)
RBC: 3.65 MIL/uL — ABNORMAL LOW (ref 4.22–5.81)
RDW: 11.8 % (ref 11.5–15.5)
WBC: 2.9 10*3/uL — ABNORMAL LOW (ref 4.0–10.5)
nRBC: 0 % (ref 0.0–0.2)

## 2018-06-15 LAB — RETICULOCYTES
Immature Retic Fract: 2.7 % (ref 2.3–15.9)
RBC.: 4.03 MIL/uL — ABNORMAL LOW (ref 4.22–5.81)
Retic Count, Absolute: 25 10*3/uL (ref 19.0–186.0)
Retic Ct Pct: 0.6 % (ref 0.4–3.1)

## 2018-06-15 LAB — CBC
HCT: 37.3 % — ABNORMAL LOW (ref 39.0–52.0)
Hemoglobin: 11.6 g/dL — ABNORMAL LOW (ref 13.0–17.0)
MCH: 29.1 pg (ref 26.0–34.0)
MCHC: 31.1 g/dL (ref 30.0–36.0)
MCV: 93.5 fL (ref 80.0–100.0)
Platelets: 122 10*3/uL — ABNORMAL LOW (ref 150–400)
RBC: 3.99 MIL/uL — ABNORMAL LOW (ref 4.22–5.81)
RDW: 11.9 % (ref 11.5–15.5)
WBC: 3.3 10*3/uL — ABNORMAL LOW (ref 4.0–10.5)
nRBC: 0 % (ref 0.0–0.2)

## 2018-06-15 LAB — BASIC METABOLIC PANEL
Anion gap: 5 (ref 5–15)
BUN: 11 mg/dL (ref 8–23)
CHLORIDE: 92 mmol/L — AB (ref 98–111)
CO2: 42 mmol/L — ABNORMAL HIGH (ref 22–32)
Calcium: 8.9 mg/dL (ref 8.9–10.3)
Creatinine, Ser: 0.57 mg/dL — ABNORMAL LOW (ref 0.61–1.24)
GFR calc Af Amer: >60 mL/min (ref >60)
GFR calc non Af Amer: >60 mL/min (ref >60)
Glucose, Bld: 82 mg/dL (ref 70–99)
Potassium: 4.3 mmol/L (ref 3.5–5.1)
Sodium: 139 mmol/L (ref 135–145)

## 2018-06-15 LAB — URINALYSIS, ROUTINE W REFLEX MICROSCOPIC
Bacteria, UA: NONE SEEN
Bilirubin Urine: NEGATIVE
Glucose, UA: NEGATIVE mg/dL
Hgb urine dipstick: NEGATIVE
Ketones, ur: 5 mg/dL — AB
Leukocytes,Ua: NEGATIVE
Nitrite: POSITIVE — AB
Protein, ur: NEGATIVE mg/dL
Specific Gravity, Urine: 1.014 (ref 1.005–1.030)
pH: 8 (ref 5.0–8.0)

## 2018-06-15 LAB — CREATININE, SERUM
Creatinine, Ser: 0.61 mg/dL (ref 0.61–1.24)
GFR calc Af Amer: >60 mL/min (ref >60)
GFR calc non Af Amer: >60 mL/min (ref >60)

## 2018-06-15 MED ORDER — ENOXAPARIN SODIUM 30 MG/0.3ML ~~LOC~~ SOLN
30.0000 mg | SUBCUTANEOUS | Status: DC
  Administered 2018-06-15 – 2018-06-16 (×2): 30 mg via SUBCUTANEOUS
  Filled 2018-06-15 (×2): qty 0.3

## 2018-06-15 MED ORDER — AZITHROMYCIN 250 MG PO TABS
250.0000 mg | ORAL_TABLET | Freq: Every day | ORAL | Status: DC
  Administered 2018-06-15 – 2018-06-16 (×2): 250 mg via ORAL
  Filled 2018-06-15 (×2): qty 1

## 2018-06-15 MED ORDER — IPRATROPIUM-ALBUTEROL 0.5-2.5 (3) MG/3ML IN SOLN
3.0000 mL | Freq: Four times a day (QID) | RESPIRATORY_TRACT | Status: DC
  Administered 2018-06-15 – 2018-06-16 (×4): 3 mL via RESPIRATORY_TRACT
  Filled 2018-06-15 (×4): qty 3

## 2018-06-15 MED ORDER — SODIUM CHLORIDE 0.9 % IV SOLN
500.0000 mg | INTRAVENOUS | Status: DC
  Filled 2018-06-15: qty 500

## 2018-06-15 MED ORDER — ENSURE ENLIVE PO LIQD
237.0000 mL | Freq: Two times a day (BID) | ORAL | Status: DC
  Administered 2018-06-15 – 2018-06-16 (×2): 237 mL via ORAL

## 2018-06-15 MED ORDER — IPRATROPIUM-ALBUTEROL 0.5-2.5 (3) MG/3ML IN SOLN: 3.0000 mL | Freq: Four times a day (QID) | RESPIRATORY_TRACT | Status: DC

## 2018-06-15 MED ORDER — SODIUM CHLORIDE 0.9 % IV SOLN
1.0000 g | INTRAVENOUS | Status: DC
  Filled 2018-06-15: qty 10

## 2018-06-15 NOTE — Progress Notes (Signed)
  Date: 06/15/2018  Patient name: Brent [name]  Medical record number: [id number]  Date of birth: 19-Dec-1926   I have seen and evaluated this patient and I have discussed the plan of care with the house staff. Please see their note for complete details. I concur with their findings with the following additions/corrections:   Please see my separate attestation of the H&P from 06/14/2018.  Lenice Pressman, M.D., Ph.D. 06/15/2018, 3:32 PM

## 2018-06-15 NOTE — Progress Notes (Signed)
   Subjective: Feels "wonderful" today compared to yesterday. Sitting up in bed, eating peaches and coffee. Off bipap, on home 2L [place]. Discussed plan to continue po antibiotic and steroid, PT eval, and possible discharge tomorrow   Objective:  Vital signs in last 24 hours: Vitals:   06/14/18 2008 06/14/18 2353 06/15/18 0409 06/15/18 0752  BP: (!) 150/75 (!) 154/70  (!) 174/76  Pulse: 85 85 84 92  Resp: 16 18 (!) 21 (!) 25  Temp:  98.7 F (37.1 C)  97.7 F (36.5 C)  TempSrc:  Axillary  Oral  SpO2: 100% 100% 100% 100%  Weight:      Height:       Gen: sitting up in bed, thin, elderly appearing male in NAD, alert and talkative  Pulm: decreased breath sounds throughout, no wheezes or crackles. Normal wob on 2L [place] Abd: soft, NT, ND Ext: warm, no LEE  Assessment/Plan:  Principal Problem:   COPD exacerbation (HCC) Active Problems:   Other pancytopenia (HCC)   Seizures ([place])  85 y.o. male p/w weakness, coughing, found to have a fever 102 with EMS. ABG showed CO2 retention, he had increased work of breathing but was satting well on room air and was placed on Bipap.   COPD exacerbation: on Bipap all night, removed around 8am. He is feeling much better and looks much better. Afebrile. No leukocytosis. Minimal cough. No crackles on exam to make me concerned about pneumonia. Procalcitonin came back < 0.10. Received ceftriaxone and azithromycin yesterday. Will d/c ceftriaxone and treat for copd exacerbation with 4 more days of po azithromycin and prednisone  - azithromycin po 250mg  and prednisone 40mg  through 2/22 - duonebs - bipap prn   Seizure disorder  - continue home phenytoin and levetiracetam   Pancytopenia: History of leukopenia and thrombocytopenia in the past, anemia is new. Baseline Hemoglobin 13 in may 2019. He has a normal MCV and RDW and no signs of active bleeding. Plt count 134 today, decreased to 110 today but roughly stable with baseline. No comments of platelet clumping  on this morning's labs.  - continue to monitor - ANC is 1.5 so he is not significantly neutropenic  - normocytic anemia worsening, sounds like he has poor nutrition, could have concomitant B12 and iron deficiencies resulting in normal MCV. Will check iron and b12 - repeat cbc tomorrow   Malnutrition: BMI 15. Eats two meals a day at home. Difficulty chewing due to poor dentition. Drinks ensure at home. No skin breakdown seen on admission.  - Ensure supplements  - soft foods   Dispo: Anticipated discharge in approximately 1 day(s) if he remains stable off bipap and PT evaluates him.   Isabelle Course, MD 06/15/2018, 11:11 AM Pager: [phone]

## 2018-06-15 NOTE — Plan of Care (Signed)
  Problem: Education: Goal: Knowledge of General Education information will improve Description Including pain rating scale, medication(s)/side effects and non-pharmacologic comfort measures 06/15/2018 0544 by Jacqulyn Ducking, RN Outcome: Progressing 06/15/2018 0352 by Jacqulyn Ducking, RN Outcome: Progressing   Problem: Clinical Measurements: Goal: Respiratory complications will improve 06/15/2018 0544 by Jacqulyn Ducking, RN Outcome: Progressing 06/15/2018 0352 by Jacqulyn Ducking, RN Outcome: Progressing   Problem: Nutrition: Goal: Adequate nutrition will be maintained Outcome: Not Progressing   Problem: Coping: Goal: Level of anxiety will decrease Outcome: Progressing   Problem: Pain Managment: Goal: General experience of comfort will improve 06/15/2018 0544 by Jacqulyn Ducking, RN Outcome: Progressing 06/15/2018 0352 by Jacqulyn Ducking, RN Outcome: Progressing   Problem: Safety: Goal: Ability to remain free from injury will improve Outcome: Progressing

## 2018-06-15 NOTE — Evaluation (Signed)
Physical Therapy Evaluation Patient Details Name: Brent [name] MRN: [id number] DOB: December 27, 1926 Today's Date: 06/15/2018   History of Present Illness  Pt is a 85 y.o. M with significant PMH of COPD with chronic respiratory failure on 2L O2, seizure, dementia, hx of prostate surgery and tobacco use  Clinical Impression  Pt admitted with above diagnosis. Pt currently with functional limitations due to the deficits listed below (see PT Problem List). Patient presenting with decreased mobility secondary to decreased activity tolerance and weakness. Ambulating 50 feet with walker, SpO2 92% on 2L O2. DOE 1/4. Pt will benefit from skilled PT to increase their independence and safety with mobility to allow discharge to the venue listed below.       Follow Up Recommendations Home health PT;Supervision for mobility/OOB    Equipment Recommendations  None recommended by PT    Recommendations for Other Services       Precautions / Restrictions Precautions Precautions: Fall Restrictions Weight Bearing Restrictions: No      Mobility  Bed Mobility Overal bed mobility: Modified Independent                Transfers Overall transfer level: Needs assistance Equipment used: Rolling walker (2 wheeled) Transfers: Sit to/from Stand Sit to Stand: Min assist         General transfer comment: minA to steady  Ambulation/Gait Ambulation/Gait assistance: Min assist Gait Distance (Feet): 50 Feet Assistive device: Rolling walker (2 wheeled) Gait Pattern/deviations: Step-through pattern;Decreased stride length;Trunk flexed;Narrow base of support   Gait velocity interpretation: <1.8 ft/sec, indicate of risk for recurrent falls General Gait Details: slow speed, no overt LOB  Stairs            Wheelchair Mobility    Modified Rankin (Stroke Patients Only)       Balance Overall balance assessment: Needs assistance Sitting-balance support: Feet supported Sitting balance-Leahy  Scale: Good     Standing balance support: Bilateral upper extremity supported Standing balance-Leahy Scale: Poor                               Pertinent Vitals/Pain Pain Assessment: No/denies pain    Home Living Family/patient expects to be discharged to:: Private residence Living Arrangements: Spouse/significant other Available Help at Discharge: Family;Available 24 hours/day Type of Home: Apartment Home Access: Stairs to enter Entrance Stairs-Rails: None Entrance Stairs-Number of Steps: 3 Home Layout: One level Home Equipment: Walker - 2 wheels;Wheelchair - manual;Shower seat      Prior Function Level of Independence: Needs assistance   Gait / Transfers Assistance Needed: household ambulation for last month, several steps with RW, becomes SOB.   ADL's / Homemaking Assistance Needed: wife assists showering, LB dressing and all IADL        Hand Dominance   Dominant Hand: Right    Extremity/Trunk Assessment   Upper Extremity Assessment Upper Extremity Assessment: Generalized weakness    Lower Extremity Assessment Lower Extremity Assessment: Generalized weakness    Cervical / Trunk Assessment Cervical / Trunk Assessment: Kyphotic  Communication   Communication: HOH  Cognition Arousal/Alertness: Awake/alert Behavior During Therapy: WFL for tasks assessed/performed Overall Cognitive Status: History of cognitive impairments - at baseline                                        General Comments      Exercises  Assessment/Plan    PT Assessment Patient needs continued PT services  PT Problem List Decreased strength;Decreased activity tolerance;Decreased balance;Decreased mobility;Cardiopulmonary status limiting activity       PT Treatment Interventions DME instruction;Gait training;Stair training;Functional mobility training;Therapeutic activities;Therapeutic exercise;Balance training;Patient/family education    PT Goals  (Current goals can be found in the Care Plan section)  Acute Rehab PT Goals Patient Stated Goal: "go home." PT Goal Formulation: With patient Time For Goal Achievement: 06/29/18 Potential to Achieve Goals: Good    Frequency Min 3X/week   Barriers to discharge        Co-evaluation               AM-PAC PT "6 Clicks" Mobility  Outcome Measure Help needed turning from your back to your side while in a flat bed without using bedrails?: None Help needed moving from lying on your back to sitting on the side of a flat bed without using bedrails?: A Little Help needed moving to and from a bed to a chair (including a wheelchair)?: A Little Help needed standing up from a chair using your arms (e.g., wheelchair or bedside chair)?: A Little Help needed to walk in hospital room?: A Little Help needed climbing 3-5 steps with a railing? : A Lot 6 Click Score: 18    End of Session Equipment Utilized During Treatment: Gait belt;Oxygen Activity Tolerance: Patient tolerated treatment well Patient left: in chair;with call bell/phone within reach;with chair alarm set Nurse Communication: Mobility status PT Visit Diagnosis: Unsteadiness on feet (R26.81);Muscle weakness (generalized) (M62.81);Difficulty in walking, not elsewhere classified (R26.2)    Time: 0093-8182 PT Time Calculation (min) (ACUTE ONLY): 24 min   Charges:   PT Evaluation $PT Eval Moderate Complexity: 1 Mod PT Treatments $Therapeutic Activity: 8-22 mins      Ellamae Sia, PT, DPT Acute Rehabilitation Services Pager [phone] Office [phone]   Willy Eddy 06/15/2018, 5:23 PM

## 2018-06-16 LAB — CBC
HCT: 34.8 % — ABNORMAL LOW (ref 39.0–52.0)
Hemoglobin: 10.9 g/dL — ABNORMAL LOW (ref 13.0–17.0)
MCH: 28.8 pg (ref 26.0–34.0)
MCHC: 31.3 g/dL (ref 30.0–36.0)
MCV: 91.8 fL (ref 80.0–100.0)
Platelets: 101 10*3/uL — ABNORMAL LOW (ref 150–400)
RBC: 3.79 MIL/uL — ABNORMAL LOW (ref 4.22–5.81)
RDW: 11.8 % (ref 11.5–15.5)
WBC: 2.9 10*3/uL — ABNORMAL LOW (ref 4.0–10.5)
nRBC: 0 % (ref 0.0–0.2)

## 2018-06-16 LAB — IRON AND TIBC
Iron: 51 ug/dL (ref 45–182)
Saturation Ratios: 19 % (ref 17.9–39.5)
TIBC: 273 ug/dL (ref 250–450)
UIBC: 222 ug/dL

## 2018-06-16 LAB — SAVE SMEAR(SSMR), FOR PROVIDER SLIDE REVIEW

## 2018-06-16 LAB — VITAMIN B12: Vitamin B-12: 180 pg/mL (ref 180–914)

## 2018-06-16 LAB — FERRITIN: Ferritin: 181 ng/mL (ref 24–336)

## 2018-06-16 MED ORDER — AZITHROMYCIN 250 MG PO TABS: 250.0000 mg | ORAL_TABLET | Freq: Every day | ORAL | 0 refills | Status: DC

## 2018-06-16 MED ORDER — PREDNISONE 20 MG PO TABS: 40.0000 mg | ORAL_TABLET | Freq: Every day | ORAL | 0 refills | Status: DC

## 2018-06-16 MED ORDER — IPRATROPIUM-ALBUTEROL 0.5-2.5 (3) MG/3ML IN SOLN
3.0000 mL | Freq: Three times a day (TID) | RESPIRATORY_TRACT | Status: DC
  Administered 2018-06-16: 3 mL via RESPIRATORY_TRACT
  Filled 2018-06-16: qty 3

## 2018-06-16 MED FILL — predniSONE 20 MG TABS: 20 | 2 days supply | Qty: 4 | Fill #0

## 2018-06-16 MED FILL — AZITHROMYCIN 250 MG TABLET: 250 | 2 days supply | Qty: 2 | Fill #0

## 2018-06-16 NOTE — Progress Notes (Signed)
   Subjective: Mr. Reinitz was sound asleep when we walked in. He was easily aroused and had no complaints. His breathing is back to baseline and he was able to work with PT yesterday. Discussed plan to go home today with [facility] PT and the remainder of his 5 day course of prednisone and azithromycin. He was in agreement and had no questions  Objective:  Vital signs in last 24 hours: Vitals:   06/15/18 2249 06/16/18 0301 06/16/18 0725 06/16/18 0737  BP: (!) 154/78   (!) 135/58  Pulse: 96   (!) 105  Resp: 16   18  Temp: 97.6 F (36.4 C)   97.7 F (36.5 C)  TempSrc: Oral   Oral  SpO2: 100% 99% 98% 100%  Weight:      Height:       Gen: laying in bed, NAD, thin elderly male  Pulm: decreased breath sounds diffusely but CTAB Cardiac: RRR, no m/r/g Ext: warm, well perfused   Assessment/Plan:  Principal Problem:   COPD exacerbation (HCC) Active Problems:   Other pancytopenia (HCC)   Seizures (HCC)   Acute respiratory failure with hypoxia and hypercarbia ([place])  85 y.o. male p/w weakness, coughing, found to have a fever 102 with EMS. ABG showed CO2 retention, he had increased work of breathing but was satting well on room air and was placed on Bipap.   COPD exacerbation: remains on RA with saturations > 88%.  Afebrile. Breathing feels good and I do not appreciate any wheezing or crackles on exam.  - discharge home today   - azithromycin po 250mg  and prednisone 40mg  through 2/22  Seizure disorder  - continue home phenytoin and levetiracetam   Pancytopenia: Chronic. B12 and iron studies were normal. Retic index is low, indicating hypoproliferation. Given chronicity and stability, no further work up is necessary at this time.   Malnutrition: BMI 15. Tolerating po intake  - Ensure supplements  - soft foods   Dispo: Anticipated discharge today with [facility] PT. Will send azithromycin and prednisone to [place]   Isabelle Course, MD 06/16/2018, 10:29 AM Pager: [phone]

## 2018-06-16 NOTE — Discharge Summary (Addendum)
Name: Brent [name] MRN: [id number] DOB: 06-23-1926 85 y.o. PCP: Chase Picket, MD  Date of Admission: 06/14/2018  8:59 AM Date of Discharge: 06/16/18 Attending Physician: Oda Kilts, MD  Discharge Diagnosis: 1. COPD exacerbation  2. Pancytopenia  Discharge Medications: Allergies as of 06/16/2018   No Known Allergies     Medication List    TAKE these medications   albuterol 108 (90 Base) MCG/ACT inhaler Commonly known as:  VENTOLIN HFA Inhale 2 puffs into the lungs every 4 (four) hours as needed for wheezing or shortness of breath.   albuterol (2.5 MG/3ML) 0.083% nebulizer solution Commonly known as:  PROVENTIL Take 3 mLs (2.5 mg total) by nebulization every 2 (two) hours as needed for wheezing or shortness of breath.   aspirin EC 81 MG tablet Take 1 tablet (81 mg total) by mouth daily. With Breakfast   azithromycin 250 MG tablet Commonly known as:  ZITHROMAX Take 1 tablet (250 mg total) by mouth daily for 2 days.   feeding supplement (ENSURE ENLIVE) Liqd Take 237 mLs by mouth 3 (three) times daily between meals.   furosemide 20 MG tablet Commonly known as:  LASIX Take 20 mg by mouth daily as needed for edema.   guaiFENesin 600 MG 12 hr tablet Commonly known as:  MUCINEX Take 1 tablet (600 mg total) by mouth 2 (two) times daily. What changed:    when to take this  reasons to take this   levETIRAcetam 1000 MG tablet Commonly known as:  KEPPRA Take 1 tablet (1,000 mg total) by mouth 2 (two) times daily.   mirtazapine 7.5 MG tablet Commonly known as:  REMERON Take 1 tablet (7.5 mg total) by mouth at bedtime. For mood and appetite What changed:  when to take this   omeprazole 20 MG capsule Commonly known as:  PRILOSEC Take 1 capsule (20 mg total) by mouth daily.   phenytoin 100 MG ER capsule Commonly known as:  DILANTIN Take 1 capsule (100 mg total) by mouth 2 (two) times daily.   predniSONE 20 MG tablet Commonly known as:   DELTASONE Take 2 tablets (40 mg total) by mouth daily with breakfast for 2 days.   tiotropium 18 MCG inhalation capsule Commonly known as:  SPIRIVA HANDIHALER Place 1 capsule (18 mcg total) into inhaler and inhale daily.   Vitamin D3 25 MCG (1000 UT) Caps Take 1,000 Units by mouth daily at 12 noon.       Disposition and follow-up:   Mr.Brent [name] was discharged from [facility] in Stable condition.  At the hospital follow up visit please address:  1.  Discharged with remainder of 5 day prednisone and azithromycin and HH PT  2.  Labs / imaging needed at time of follow-up: CBC to check stability of pancytopenia  3.  Pending labs/ test needing follow-up: none   Follow-up Appointments: Follow-up Information    [facility] Follow up.   Specialty:  Home Health Services Why:  [place], [place] information: [place] [address] [phone]           Hospital Course by problem list: 1. COPD Exacerbation: 61yoM with COPD, seizure disorder, chronic pancytopenia p/w weakness, coughing, fever 102F. Initial ABG showed CO2 retention. Flu A and B were negative. CXR did not show signs of pneumonia. He had increased wob but was maintaining O2 saturations. He was placed on bipap overnight and significantly improved the next morning. He was given ceftriaxone and azithromycin  in the ED. Procalcitonin level came back low, < 0.10 so ceftriaxone was discontinued. Blood cultures no growth at two days. He was treated with 5 days of azithromycin and prednisone and discharged home with [facility] PT.   Pancytopenia: Chronic, stable. B12 and iron studies were normal. Retic index is low, indicating hypoproliferation. Review of smear showed some atypical lymphocytes, which could be artifactual due to prolonged storage of the blood before smear. Could consider fresh smear, although given his frailty and life expectancy, as well as the stability of his  pancytopenia over time, unlikely would evaluate further with bone marrow biopsy or consider treatment if he had a lymphoma.   Seizure disorder: Continued home phenytoin and levetiracetam  Malnutrition: He has severe cachexia from his chronic illness. He already drinks ensure three times daily. Would recommend maximizing nutrition and consider nutrition referral to discuss additional strategies.   Discharge Vitals:   BP (!) 135/58   Pulse (!) 105   Temp 97.7 F (36.5 C) (Oral)   Resp 18   Ht '5\' 6"'  (1.676 m)   Wt 44.8 kg   SpO2 100%   BMI 15.94 kg/m   Pertinent Labs, Studies, and Procedures:  Results for orders placed or performed during the hospital encounter of 06/14/18  Blood Culture (routine x 2)     Status: None (Preliminary result)   Collection Time: 06/14/18  9:10 AM  Result Value Ref Range Status   Specimen Description BLOOD RIGHT ANTECUBITAL  Final   Special Requests   Final    BOTTLES DRAWN AEROBIC AND ANAEROBIC Blood Culture adequate volume   Culture   Final    NO GROWTH 2 DAYS Performed at [facility], [place] [address] [place], [place] [address]    Report Status PENDING  Incomplete  Blood Culture (routine x 2)     Status: None (Preliminary result)   Collection Time: 06/14/18  9:36 AM  Result Value Ref Range Status   Specimen Description BLOOD LEFT ANTECUBITAL  Final   Special Requests   Final    BOTTLES DRAWN AEROBIC AND ANAEROBIC Blood Culture results may not be optimal due to an inadequate volume of blood received in culture bottles   Culture   Final    NO GROWTH 2 DAYS Performed at [facility], [place] [address], [place], [place] [address]    Report Status PENDING  Incomplete  MRSA PCR Screening     Status: None   Collection Time: 06/14/18  6:10 PM  Result Value Ref Range Status   MRSA by PCR NEGATIVE NEGATIVE Final    Comment:        The GeneXpert MRSA Assay (FDA approved for NASAL specimens only), is one component of a comprehensive MRSA  colonization surveillance program. It is not intended to diagnose MRSA infection nor to guide or monitor treatment for MRSA infections. Performed at [facility], [place] [address] [place], [place] [address]    BMP Latest Ref Rng & Units 06/15/2018 06/15/2018 06/14/2018  Glucose 70 - 99 mg/dL - 82 144(H)  BUN 8 - 23 mg/dL - 11 14  Creatinine 0.61 - 1.24 mg/dL 0.61 0.57(L) 0.67  Sodium 135 - 145 mmol/L - 139 138  Potassium 3.5 - 5.1 mmol/L - 4.3 4.9  Chloride 98 - 111 mmol/L - 92(L) 85(L)  CO2 22 - 32 mmol/L - 42(H) 45(H)  Calcium 8.9 - 10.3 mg/dL - 8.9 9.5   CBC Latest Ref Rng & Units 06/16/2018 06/15/2018 06/15/2018  WBC 4.0 - 10.5 K/uL 2.9(L) 3.3(L) 2.9(L)  Hemoglobin 13.0 - 17.0 g/dL 10.9(L) 11.6(L) 10.4(L)  Hematocrit 39.0 - 52.0 % 34.8(L) 37.3(L) 34.4(L)  Platelets 150 - 400 K/uL 101(L) 122(L) 110(L)   CXR 2/18:  FINDINGS: Cardiac silhouette is top-normal in size. No mediastinal or hilar masses. No evidence of adenopathy.  Lungs are hyperexpanded. There is a paucity of vascular markings in the mid to upper lungs consistent with emphysema. Mild interstitial thickening noted in the lower lungs. These findings are stable. No evidence of pneumonia or pulmonary edema. No pleural effusion or pneumothorax.  Skeletal structures are grossly intact.  IMPRESSION: 1. No acute cardiopulmonary disease. 2. Advanced emphysema/COPD.  Stable appearance from the prior study.  Discharge Instructions: Discharge Instructions    Diet - low sodium heart healthy   Complete by:  As directed    Discharge instructions   Complete by:  As directed    Mr. Rowin, [place] were admitted to the hospital because you were having trouble breathing and felt very weak. We think you had some sort of viral illness that caused your underlying lung disease (COPD/emphysema) to flare up. We started you on an antibiotic called azithromycin and a steroid called prednisone. These medicines should have been  delivered to your room. Please continue taking these tomorrow after you get home.   For your weakness, it is important to eat and drink as much as you can. It's great that you drink Ensures at home. Keep doing that! We have also arranged some home health physical therapy to help you get stronger after you get home.  If you experience worsening shortness of breath, fevers, or weakness please come back to the hospital.   Increase activity slowly   Complete by:  As directed       Signed: Isabelle Course, MD 06/16/2018, 1:17 PM   Pager: [phone]     Internal Medicine Attending Note:  I saw and examined the patient on the day of discharge. I reviewed and agree with the discharge summary written by the house staff.  Lenice Pressman, M.D., Ph.D.

## 2018-06-16 NOTE — Care Management Note (Addendum)
Case Management Note  Patient Details  Name: Brent [name] MRN: [id number] Date of Birth: Sep 09, 1926  Subjective/Objective:  For dc to home with daughter today, NCM spoke with daughter offered choice for HHPT, [facility], [facility] , referral made to [facility] with [facility], soc will begin 24-48 hrs post dc. Daughter states he needs ambulance transport home, address confirmed. Ambulance forms on chart, paged MD to sign DNR form.                    Action/Plan: DC home when ready.  Expected Discharge Date:  06/16/18               Expected Discharge Plan:  [place]  In-House Referral:     Discharge planning Services  CM Consult  Post Acute Care Choice:  Home Health Choice offered to:  Adult Children  DME Arranged:    DME Agency:     HH Arranged:  PT, RN [place] Agency:  [place]  Status of Service:  Completed, signed off  If discussed at [place] of Stay Meetings, dates discussed:    Additional Comments:  Zenon Mayo, RN 06/16/2018, 10:47 AM

## 2018-06-18 ENCOUNTER — Inpatient Hospital Stay (HOSPITAL_COMMUNITY)
Admission: EM | Admit: 2018-06-18 | Discharge: 2018-06-22 | DRG: 189 | Disposition: A | Discharge status: Skilled Nursing Facility | Payer: MEDICARE | Attending: Internal Medicine | Admitting: Internal Medicine

## 2018-06-18 ENCOUNTER — Emergency Department (HOSPITAL_COMMUNITY): Payer: MEDICARE

## 2018-06-18 ENCOUNTER — Other Ambulatory Visit: Payer: Self-pay

## 2018-06-18 ENCOUNTER — Encounter (HOSPITAL_COMMUNITY): Payer: Self-pay

## 2018-06-18 DIAGNOSIS — Z9981 Dependence on supplemental oxygen: Secondary | ICD-10-CM | POA: Diagnosis not present

## 2018-06-18 DIAGNOSIS — G40909 Epilepsy, unspecified, not intractable, without status epilepticus: Secondary | ICD-10-CM

## 2018-06-18 DIAGNOSIS — R634 Abnormal weight loss: Secondary | ICD-10-CM | POA: Diagnosis not present

## 2018-06-18 DIAGNOSIS — E872 Acidosis: Secondary | ICD-10-CM | POA: Diagnosis present

## 2018-06-18 DIAGNOSIS — M79671 Pain in right foot: Secondary | ICD-10-CM | POA: Diagnosis not present

## 2018-06-18 DIAGNOSIS — Z7982 Long term (current) use of aspirin: Secondary | ICD-10-CM

## 2018-06-18 DIAGNOSIS — Z7951 Long term (current) use of inhaled steroids: Secondary | ICD-10-CM | POA: Diagnosis not present

## 2018-06-18 DIAGNOSIS — R531 Weakness: Secondary | ICD-10-CM | POA: Diagnosis not present

## 2018-06-18 DIAGNOSIS — J449 Chronic obstructive pulmonary disease, unspecified: Secondary | ICD-10-CM | POA: Diagnosis present

## 2018-06-18 DIAGNOSIS — Z66 Do not resuscitate: Secondary | ICD-10-CM | POA: Diagnosis not present

## 2018-06-18 DIAGNOSIS — Z87891 Personal history of nicotine dependence: Secondary | ICD-10-CM

## 2018-06-18 DIAGNOSIS — R627 Adult failure to thrive: Secondary | ICD-10-CM | POA: Diagnosis not present

## 2018-06-18 DIAGNOSIS — J9622 Acute and chronic respiratory failure with hypercapnia: Secondary | ICD-10-CM | POA: Diagnosis present

## 2018-06-18 DIAGNOSIS — Z9911 Dependence on respirator [ventilator] status: Secondary | ICD-10-CM | POA: Diagnosis not present

## 2018-06-18 DIAGNOSIS — Z681 Body mass index (BMI) 19 or less, adult: Secondary | ICD-10-CM | POA: Diagnosis not present

## 2018-06-18 DIAGNOSIS — F0390 Unspecified dementia without behavioral disturbance: Secondary | ICD-10-CM | POA: Diagnosis present

## 2018-06-18 DIAGNOSIS — T380X5A Adverse effect of glucocorticoids and synthetic analogues, initial encounter: Secondary | ICD-10-CM | POA: Diagnosis present

## 2018-06-18 DIAGNOSIS — F339 Major depressive disorder, recurrent, unspecified: Secondary | ICD-10-CM | POA: Diagnosis present

## 2018-06-18 DIAGNOSIS — J9602 Acute respiratory failure with hypercapnia: Secondary | ICD-10-CM

## 2018-06-18 DIAGNOSIS — Z79899 Other long term (current) drug therapy: Secondary | ICD-10-CM | POA: Diagnosis not present

## 2018-06-18 DIAGNOSIS — D61818 Other pancytopenia: Secondary | ICD-10-CM | POA: Diagnosis present

## 2018-06-18 DIAGNOSIS — Z515 Encounter for palliative care: Secondary | ICD-10-CM | POA: Diagnosis not present

## 2018-06-18 DIAGNOSIS — J9621 Acute and chronic respiratory failure with hypoxia: Secondary | ICD-10-CM | POA: Diagnosis present

## 2018-06-18 DIAGNOSIS — R0902 Hypoxemia: Secondary | ICD-10-CM | POA: Diagnosis present

## 2018-06-18 DIAGNOSIS — J9601 Acute respiratory failure with hypoxia: Secondary | ICD-10-CM

## 2018-06-18 DIAGNOSIS — K5900 Constipation, unspecified: Secondary | ICD-10-CM | POA: Diagnosis present

## 2018-06-18 DIAGNOSIS — R0689 Other abnormalities of breathing: Secondary | ICD-10-CM | POA: Diagnosis present

## 2018-06-18 DIAGNOSIS — E43 Unspecified severe protein-calorie malnutrition: Secondary | ICD-10-CM | POA: Diagnosis present

## 2018-06-18 DIAGNOSIS — D72819 Decreased white blood cell count, unspecified: Secondary | ICD-10-CM | POA: Diagnosis not present

## 2018-06-18 DIAGNOSIS — Z7189 Other specified counseling: Secondary | ICD-10-CM | POA: Diagnosis not present

## 2018-06-18 LAB — POCT I-STAT 7, (LYTES, BLD GAS, ICA,H+H)
Acid-Base Excess: 17 mmol/L — ABNORMAL HIGH (ref 0.0–2.0)
Bicarbonate: 45 mmol/L — ABNORMAL HIGH (ref 20.0–28.0)
Calcium, Ion: 1.21 mmol/L (ref 1.15–1.40)
HCT: 31 % — ABNORMAL LOW (ref 39.0–52.0)
Hemoglobin: 10.5 g/dL — ABNORMAL LOW (ref 13.0–17.0)
O2 Saturation: 90 %
POTASSIUM: 4.1 mmol/L (ref 3.5–5.1)
Patient temperature: 98.6
Sodium: 136 mmol/L (ref 135–145)
TCO2: 47 mmol/L — ABNORMAL HIGH (ref 22–32)
pCO2 arterial: 73.9 mmHg (ref 32.0–48.0)
pH, Arterial: 7.392 (ref 7.350–7.450)
pO2, Arterial: 62 mmHg — ABNORMAL LOW (ref 83.0–108.0)

## 2018-06-18 LAB — INFLUENZA PANEL BY PCR (TYPE A & B)
Influenza A By PCR: NEGATIVE
Influenza B By PCR: NEGATIVE

## 2018-06-18 LAB — COMPREHENSIVE METABOLIC PANEL
ALT: 14 U/L (ref 0–44)
AST: 28 U/L (ref 15–41)
Albumin: 3.6 g/dL (ref 3.5–5.0)
Alkaline Phosphatase: 73 U/L (ref 38–126)
Anion gap: 6 (ref 5–15)
BUN: 13 mg/dL (ref 8–23)
CO2: 42 mmol/L — ABNORMAL HIGH (ref 22–32)
Calcium: 8.9 mg/dL (ref 8.9–10.3)
Chloride: 91 mmol/L — ABNORMAL LOW (ref 98–111)
Creatinine, Ser: 0.59 mg/dL — ABNORMAL LOW (ref 0.61–1.24)
GFR calc Af Amer: >60 mL/min (ref >60)
GFR calc non Af Amer: >60 mL/min (ref >60)
Glucose, Bld: 107 mg/dL — ABNORMAL HIGH (ref 70–99)
Potassium: 4.6 mmol/L (ref 3.5–5.1)
Sodium: 139 mmol/L (ref 135–145)
Total Bilirubin: 0.3 mg/dL (ref 0.3–1.2)
Total Protein: 7.1 g/dL (ref 6.5–8.1)

## 2018-06-18 LAB — POCT I-STAT EG7
Acid-Base Excess: 16 mmol/L — ABNORMAL HIGH (ref 0.0–2.0)
Bicarbonate: 45.9 mmol/L — ABNORMAL HIGH (ref 20.0–28.0)
Calcium, Ion: 1.1 mmol/L — ABNORMAL LOW (ref 1.15–1.40)
HCT: 38 % — ABNORMAL LOW (ref 39.0–52.0)
Hemoglobin: 12.9 g/dL — ABNORMAL LOW (ref 13.0–17.0)
O2 Saturation: 74 %
Potassium: 4.6 mmol/L (ref 3.5–5.1)
Sodium: 135 mmol/L (ref 135–145)
TCO2: 48 mmol/L — ABNORMAL HIGH (ref 22–32)
pCO2, Ven: 83.9 mmHg (ref 44.0–60.0)
pH, Ven: 7.346 (ref 7.250–7.430)
pO2, Ven: 44 mmHg (ref 32.0–45.0)

## 2018-06-18 LAB — LACTIC ACID, PLASMA
Lactic Acid, Venous: 0.7 mmol/L (ref 0.5–1.9)
Lactic Acid, Venous: 1.1 mmol/L (ref 0.5–1.9)

## 2018-06-18 LAB — CBC WITH DIFFERENTIAL/PLATELET
Abs Immature Granulocytes: 0.01 K/uL (ref 0.00–0.07)
Basophils Absolute: 0 K/uL (ref 0.0–0.1)
Basophils Relative: 0 %
Eosinophils Absolute: 0.2 K/uL (ref 0.0–0.5)
Eosinophils Relative: 4 %
HCT: 40.8 % (ref 39.0–52.0)
Hemoglobin: 12 g/dL — ABNORMAL LOW (ref 13.0–17.0)
Immature Granulocytes: 0 %
Lymphocytes Relative: 17 %
Lymphs Abs: 0.8 K/uL (ref 0.7–4.0)
MCH: 28.7 pg (ref 26.0–34.0)
MCHC: 29.4 g/dL — ABNORMAL LOW (ref 30.0–36.0)
MCV: 97.6 fL (ref 80.0–100.0)
Monocytes Absolute: 0.6 K/uL (ref 0.1–1.0)
Monocytes Relative: 12 %
Neutro Abs: 3.1 K/uL (ref 1.7–7.7)
Neutrophils Relative %: 67 %
Platelets: 118 K/uL — ABNORMAL LOW (ref 150–400)
RBC: 4.18 MIL/uL — ABNORMAL LOW (ref 4.22–5.81)
RDW: 12 % (ref 11.5–15.5)
WBC: 4.7 K/uL (ref 4.0–10.5)
nRBC: 0 % (ref 0.0–0.2)

## 2018-06-18 LAB — SAVE SMEAR(SSMR), FOR PROVIDER SLIDE REVIEW

## 2018-06-18 LAB — URINALYSIS, ROUTINE W REFLEX MICROSCOPIC
Bilirubin Urine: NEGATIVE
Glucose, UA: NEGATIVE mg/dL
Hgb urine dipstick: NEGATIVE
Ketones, ur: NEGATIVE mg/dL
Leukocytes,Ua: NEGATIVE
Nitrite: NEGATIVE
Protein, ur: NEGATIVE mg/dL
Specific Gravity, Urine: 1.021 (ref 1.005–1.030)
pH: 7 (ref 5.0–8.0)

## 2018-06-18 LAB — MRSA PCR SCREENING: MRSA by PCR: NEGATIVE

## 2018-06-18 LAB — PROCALCITONIN: Procalcitonin: <0.1 ng/mL

## 2018-06-18 MED ORDER — SODIUM CHLORIDE 0.9% FLUSH
3.0000 mL | Freq: Two times a day (BID) | INTRAVENOUS | Status: DC
  Administered 2018-06-18 – 2018-06-22 (×7): 3 mL via INTRAVENOUS

## 2018-06-18 MED ORDER — CHLORHEXIDINE GLUCONATE 0.12 % MT SOLN
15.0000 mL | Freq: Two times a day (BID) | OROMUCOSAL | Status: DC
  Administered 2018-06-18 – 2018-06-22 (×8): 15 mL via OROMUCOSAL
  Filled 2018-06-18 (×9): qty 15

## 2018-06-18 MED ORDER — VITAMIN D 25 MCG (1000 UNIT) PO TABS
1000.0000 [IU] | ORAL_TABLET | Freq: Every day | ORAL | Status: DC
  Administered 2018-06-18 – 2018-06-22 (×5): 1000 [IU] via ORAL
  Filled 2018-06-18 (×5): qty 1

## 2018-06-18 MED ORDER — IPRATROPIUM-ALBUTEROL 0.5-2.5 (3) MG/3ML IN SOLN
3.0000 mL | Freq: Once | RESPIRATORY_TRACT | Status: AC
  Administered 2018-06-18: 3 mL via RESPIRATORY_TRACT
  Filled 2018-06-18: qty 3

## 2018-06-18 MED ORDER — ENOXAPARIN SODIUM 40 MG/0.4ML ~~LOC~~ SOLN
40.0000 mg | SUBCUTANEOUS | Status: DC
  Administered 2018-06-18: 40 mg via SUBCUTANEOUS
  Filled 2018-06-18: qty 0.4

## 2018-06-18 MED ORDER — SODIUM CHLORIDE 0.9 % IV SOLN
1.0000 g | INTRAVENOUS | Status: DC
  Filled 2018-06-18: qty 1

## 2018-06-18 MED ORDER — SODIUM CHLORIDE 0.9 % IV SOLN
1000.0000 mL | INTRAVENOUS | Status: DC
  Administered 2018-06-18: 1000 mL via INTRAVENOUS

## 2018-06-18 MED ORDER — GUAIFENESIN ER 600 MG PO TB12: 600.0000 mg | ORAL_TABLET | Freq: Two times a day (BID) | ORAL | Status: DC | PRN

## 2018-06-18 MED ORDER — PHENYTOIN SODIUM EXTENDED 100 MG PO CAPS
100.0000 mg | ORAL_CAPSULE | Freq: Two times a day (BID) | ORAL | Status: DC
  Administered 2018-06-18 – 2018-06-22 (×9): 100 mg via ORAL
  Filled 2018-06-18 (×9): qty 1

## 2018-06-18 MED ORDER — SODIUM CHLORIDE 0.9% FLUSH
3.0000 mL | Freq: Two times a day (BID) | INTRAVENOUS | Status: DC
  Administered 2018-06-18 – 2018-06-22 (×8): 3 mL via INTRAVENOUS

## 2018-06-18 MED ORDER — SODIUM CHLORIDE 0.9% FLUSH: 3.0000 mL | INTRAVENOUS | Status: DC | PRN

## 2018-06-18 MED ORDER — MIRTAZAPINE 7.5 MG PO TABS
7.5000 mg | ORAL_TABLET | Freq: Every day | ORAL | Status: DC
  Administered 2018-06-18 – 2018-06-21 (×4): 7.5 mg via ORAL
  Filled 2018-06-18 (×5): qty 1

## 2018-06-18 MED ORDER — ASPIRIN EC 81 MG PO TBEC
81.0000 mg | DELAYED_RELEASE_TABLET | Freq: Every day | ORAL | Status: DC
  Administered 2018-06-18 – 2018-06-22 (×5): 81 mg via ORAL
  Filled 2018-06-18 (×5): qty 1

## 2018-06-18 MED ORDER — LEVETIRACETAM 500 MG PO TABS
1000.0000 mg | ORAL_TABLET | Freq: Two times a day (BID) | ORAL | Status: DC
  Administered 2018-06-18 – 2018-06-22 (×9): 1000 mg via ORAL
  Filled 2018-06-18 (×9): qty 2

## 2018-06-18 MED ORDER — VANCOMYCIN HCL IN DEXTROSE 1-5 GM/200ML-% IV SOLN
1000.0000 mg | Freq: Once | INTRAVENOUS | Status: DC
  Filled 2018-06-18: qty 200

## 2018-06-18 MED ORDER — IPRATROPIUM-ALBUTEROL 0.5-2.5 (3) MG/3ML IN SOLN
3.0000 mL | Freq: Four times a day (QID) | RESPIRATORY_TRACT | Status: DC
  Administered 2018-06-18 (×2): 3 mL via RESPIRATORY_TRACT
  Filled 2018-06-18 (×2): qty 3

## 2018-06-18 MED ORDER — VANCOMYCIN HCL 10 G IV SOLR: 1250.0000 mg | INTRAVENOUS | Status: DC

## 2018-06-18 MED ORDER — TIOTROPIUM BROMIDE MONOHYDRATE 18 MCG IN CAPS: 18.0000 ug | ORAL_CAPSULE | Freq: Every day | RESPIRATORY_TRACT | Status: DC

## 2018-06-18 MED ORDER — ENOXAPARIN SODIUM 30 MG/0.3ML ~~LOC~~ SOLN
30.0000 mg | SUBCUTANEOUS | Status: DC
  Administered 2018-06-19 – 2018-06-22 (×4): 30 mg via SUBCUTANEOUS
  Filled 2018-06-18 (×4): qty 0.3

## 2018-06-18 MED ORDER — ENSURE ENLIVE PO LIQD
237.0000 mL | Freq: Three times a day (TID) | ORAL | Status: DC
  Administered 2018-06-19 – 2018-06-22 (×9): 237 mL via ORAL

## 2018-06-18 MED ORDER — PANTOPRAZOLE SODIUM 40 MG PO TBEC
40.0000 mg | DELAYED_RELEASE_TABLET | Freq: Every day | ORAL | Status: DC
  Administered 2018-06-18 – 2018-06-22 (×5): 40 mg via ORAL
  Filled 2018-06-18 (×5): qty 1

## 2018-06-18 MED ORDER — SODIUM CHLORIDE 0.9 % IV SOLN
2.0000 g | Freq: Once | INTRAVENOUS | Status: AC
  Administered 2018-06-18: 2 g via INTRAVENOUS
  Filled 2018-06-18: qty 2

## 2018-06-18 MED ORDER — ORAL CARE MOUTH RINSE
15.0000 mL | Freq: Two times a day (BID) | OROMUCOSAL | Status: DC
  Administered 2018-06-18 – 2018-06-22 (×5): 15 mL via OROMUCOSAL

## 2018-06-18 NOTE — H&P (Addendum)
Date: 06/18/2018               Patient Name:  Brent [name] MRN: [id number]  DOB: 1926-05-14 Age / Sex: 85 y.o., male   PCP: Chase Picket, MD         Medical Service: Internal Medicine Teaching Service         Attending Physician: Dr. Oval Linsey, MD    First Contact: Dr. Donne Hazel Pager: [phone]  Second Contact: Dr. Trilby Drummer Pager: [phone]       After Hours (After 5p/  First Contact Pager: [phone]  weekends / holidays): Second Contact Pager: [phone]   Chief Complaint: lethargy, fever, weakness  History of Present Illness: 89yoM with COPD on home O2, seizure disorder, dementia, and depression presenting with one day of dry cough, fever, and weakness. He was recently admitted 2/18- 2/20 for similar symptoms, required bipap overnight and was treated with azithromycin and prednisone for presumed copd exacerbation. Respiratory status quickly improved. The wife is at bedside and helps with the history since Brent [name] is on the bipap. She reports Brent [name] was completely back to baseline after his recent hospital discharge. His appetite was "great" and she denies any sick contacts, n/v/d, dysuria, or increased sob. He has been taking azithromycin and prednisone as prescribed and today would have been the end of his 5 day course.  She states that this morning he was too weak to get out of bed and had a temperature of 100.0 F. When EMS arrived, his O2 saturation was 50% on RA, quickly increased to 90% after placing non rebreather. HR 120, BP 126/58, RR 18, T 100F.   In the ED, code sepsis was called. He was placed on bipap for increased wob and treated with IVFs and cefepime. Vancomycin was held given normal lactate, afebrile, no leukocytosis.   The wife also adds that he has lost 50lbs over the past year and does not usually require hospitalization for lung issues. They have lived in [place] for 13 months and these two recent hospitalizations are the first that he has had. Prior  to moving to [place], he was in and out of the hospital for seizures.   Meds:  Current Meds  Medication Sig  . acetaminophen (TYLENOL) 500 MG tablet Take 1,000 mg by mouth every 6 (six) hours as needed for mild pain.  Marland Kitchen albuterol (PROVENTIL) (2.5 MG/3ML) 0.083% nebulizer solution Take 3 mLs (2.5 mg total) by nebulization every 2 (two) hours as needed for wheezing or shortness of breath.  Marland Kitchen albuterol (VENTOLIN HFA) 108 (90 Base) MCG/ACT inhaler Inhale 2 puffs into the lungs every 4 (four) hours as needed for wheezing or shortness of breath.  Marland Kitchen aspirin EC 81 MG tablet Take 1 tablet (81 mg total) by mouth daily. With Breakfast  . azithromycin (ZITHROMAX) 250 MG tablet Take 1 tablet (250 mg total) by mouth daily for 2 days.  . Cholecalciferol (VITAMIN D3) 1000 units CAPS Take 1,000 Units by mouth daily at 12 noon.   . feeding supplement, ENSURE ENLIVE, (ENSURE ENLIVE) LIQD Take 237 mLs by mouth 3 (three) times daily between meals.  . furosemide (LASIX) 20 MG tablet Take 20 mg by mouth daily as needed for edema.  Marland Kitchen guaiFENesin (MUCINEX) 600 MG 12 hr tablet Take 1 tablet (600 mg total) by mouth 2 (two) times daily. (Patient taking differently: Take 600 mg by mouth 2 (two) times daily as needed for cough or to loosen phlegm. )  . levETIRAcetam (KEPPRA)  1000 MG tablet Take 1 tablet (1,000 mg total) by mouth 2 (two) times daily.  . mirtazapine (REMERON) 7.5 MG tablet Take 1 tablet (7.5 mg total) by mouth at bedtime. For mood and appetite (Patient taking differently: Take 7.5 mg by mouth every other day. For mood and appetite)  . omeprazole (PRILOSEC) 20 MG capsule Take 1 capsule (20 mg total) by mouth daily.  . phenytoin (DILANTIN) 100 MG ER capsule Take 1 capsule (100 mg total) by mouth 2 (two) times daily.  . predniSONE (DELTASONE) 20 MG tablet Take 2 tablets (40 mg total) by mouth daily with breakfast for 2 days.     Allergies: Allergies as of 06/18/2018  . (No Known Allergies)   Past Medical  History:  Diagnosis Date  . Asthma   . Cancer ([place])    cancer  . Emphysema of lung ([place])   . Seizures ([place])     Family History: History reviewed. No pertinent family history.  Social History: lives at home with wife. Has a cousin that lives close by. No alcohol use. Former smoker.   Review of Systems: A complete ROS was negative except as per HPI.   Physical Exam: Blood pressure (!) 119/59, pulse 92, temperature 98.4 F (36.9 C), temperature source Rectal, resp. rate 15, height 5\' 6"  (1.676 m), weight 44.8 kg, SpO2 98 %. Gen: frail, cachetic, elderly appearing African American male on bipap Cardiac: RRR, no m/r/g, no JVD Pulm: on bipap, difficult to hear breath sounds over bipap Abd: +BS, soft, NT, ND Ext: warm, strong DP pulses, 2+ pitting edema around the left ankle without redness or pain.   Labs:  VBG: pH 7.35, pCO2 83, bicarb 45 CMP: CO2 42 CBC: WBC 4.7, Hgb 12, plts 118  Lactate 0.7  BCx in process   EKG: personally reviewed my interpretation is sinus tach HR 113, biatrial enlargement   CXR: personally reviewed my interpretation is stable emphysematous changes. No focal consolidation or effusion   Assessment & Plan by Problem: Active Problems:   Acute on chronic respiratory failure with hypoxia and hypercapnia (HCC)  89yoM with COPD on home O2, seizure disorder, dementia, and depression presenting with acute on chronic hypoxic, hypercapnic respiratory failure in the setting of one day of dry cough, low grade fever, and weakness.   Acute on Chronic hypoxic, hypercarbic respiratory failure: Found by EMS to have initial O2 50% on RA. On 2L [place] at home. Oxygen tubing is 2ft long so there could be a component of increased resistance causing decreased oxygen delivery. Tubing could have also gotten kinked last night. It appears that he gets hypoxic while sleeping because both this admission and the last admission, he presented hypoxic and weak when waking up. Underlying  copd is also contributing. Reports one day of dry cough and low grade fevers. Could have been exposed to illness during recent hospital admission. Labs show chronic respiratory acidosis which is well compensated with pH 7.39, pco2 73, and bicarb 45.   - procalcitonin - npo for bipap - s/p cefepime, will hold further antibiotics pending procalcitonin  - scheduled duonebs q6h - mucinex prn  - try to get a home oximeter   Pancytopenia: Chronically leukopenic. WBC is actually higher than previous, maybe secondary to prednisone. Some concern for lymphoma after reviewing blood smear from last hospitalization but will need to review a fresh blood smear to confirm. 50lb weight loss over the last year is also concerning. Normocytic anemia Hgb 12.9, recent iron and b12 studies were  normal.  - blood smear  Seizure Disorder: Well controlled. Continue home keppra and dilantin   DVT ppx: lovenox Code: DNR FEN/GI: sips with meds while on bipap, if does well off bipap for a few hours, he can have a regular diet. No IVFs  Dispo: Admit patient to Inpatient with expected length of stay greater than 2 midnights.  Signed: Isabelle Course, MD 06/18/2018, 11:02 AM  Pager: [phone]

## 2018-06-18 NOTE — ED Notes (Signed)
ED Provider at bedside. 

## 2018-06-18 NOTE — ED Notes (Signed)
Admitting MDs bedside.

## 2018-06-18 NOTE — ED Notes (Signed)
ED TO INPATIENT HANDOFF REPORT  ED Nurse Name and Phone #:  Darnelle Spangle, [place]  S Name/Age/Gender Brent [name] 85 y.o. male Room/Bed: 031C/031C  Code Status   Code Status: DNR  Home/SNF/Other Home Patient oriented to:  Is this baseline?  Triage Complete: Triage complete  Chief Complaint Fever, low stats  Triage Note Pt arrives with [facility] c/o lethargy, fever, and weakness. Pt's family stated to EMS that pt was "fine" last night and woke up weak this morning. Pt was here in the ED for the same sxs; Pt recently dx'd with COPD. O2 sat 50% on RA; EMS applied non rebreather, sat up to 90%.  EMS vitals: BP 120/60 HR 100 18 RR CBG 128 Temp 100 F       Allergies No Known Allergies  Level of Care/Admitting Diagnosis ED Disposition    ED Disposition Condition Comment   Admit  Hospital Area: [facility] [[id number]]  Level of Care: Progressive [102]  Diagnosis: Acute on chronic respiratory failure with hypoxia and hypercapnia [facility]) [[id number]]  Admitting Physician: [place], [place]  Attending Physician: Oval Linsey [phone]  Estimated length of stay: past midnight tomorrow  Certification:: I certify this patient will need inpatient services for at least 2 midnights  PT Class (Do Not Modify): Inpatient [101]  PT Acc Code (Do Not Modify): Private [1]       B Medical/Surgery History Past Medical History:  Diagnosis Date  . Asthma   . Cancer ([place])    cancer  . Emphysema of lung ([place])   . Seizures ([place])    History reviewed. No pertinent surgical history.   A IV Location/Drains/Wounds Patient Lines/Drains/Airways Status   Active Line/Drains/Airways    Name:   Placement date:   Placement time:   Site:   Days:   Peripheral IV 06/18/18 Left Wrist   06/18/18    0809    Wrist   less than 1   Peripheral IV 06/18/18 Left Forearm   06/18/18    0816    Forearm   less than 1   External Urinary Catheter   06/18/18    7124    -    less than 1          Intake/Output Last 24 hours No intake or output data in the 24 hours ending 06/18/18 1039  Labs/Imaging Results for orders placed or performed during the hospital encounter of 06/18/18 (from the past 48 hour(s))  Lactic acid, plasma     Status: None   Collection Time: 06/18/18  8:12 AM  Result Value Ref Range   Lactic Acid, Venous 0.7 0.5 - 1.9 mmol/L    Comment: Performed at [facility], [address], [place], [place] [address]  Comprehensive metabolic panel     Status: Abnormal   Collection Time: 06/18/18  8:12 AM  Result Value Ref Range   Sodium 139 135 - 145 mmol/L   Potassium 4.6 3.5 - 5.1 mmol/L   Chloride 91 (L) 98 - 111 mmol/L   CO2 42 (H) 22 - 32 mmol/L   Glucose, Bld 107 (H) 70 - 99 mg/dL   BUN 13 8 - 23 mg/dL   Creatinine, Ser 0.59 (L) 0.61 - 1.24 mg/dL   Calcium 8.9 8.9 - 10.3 mg/dL   Total Protein 7.1 6.5 - 8.1 g/dL   Albumin 3.6 3.5 - 5.0 g/dL   AST 28 15 - 41 U/L   ALT 14 0 - 44 U/L  Alkaline Phosphatase 73 38 - 126 U/L   Total Bilirubin 0.3 0.3 - 1.2 mg/dL   GFR calc non Af Amer >60 >60 mL/min   GFR calc Af Amer >60 >60 mL/min   Anion gap 6 5 - 15    Comment: Performed at [facility] [address] [place], [place] [address]  CBC WITH DIFFERENTIAL     Status: Abnormal   Collection Time: 06/18/18  8:12 AM  Result Value Ref Range   WBC 4.7 4.0 - 10.5 K/uL   RBC 4.18 (L) 4.22 - 5.81 MIL/uL   Hemoglobin 12.0 (L) 13.0 - 17.0 g/dL   HCT 40.8 39.0 - 52.0 %   MCV 97.6 80.0 - 100.0 fL   MCH 28.7 26.0 - 34.0 pg   MCHC 29.4 (L) 30.0 - 36.0 g/dL   RDW 12.0 11.5 - 15.5 %   Platelets 118 (L) 150 - 400 K/uL    Comment: REPEATED TO VERIFY PLATELET COUNT CONFIRMED BY SMEAR SPECIMEN CHECKED FOR CLOTS Immature Platelet Fraction may be clinically indicated, consider ordering this additional test [id number]    nRBC 0.0 0.0 - 0.2 %   Neutrophils Relative % 67 %   Neutro Abs 3.1 1.7 - 7.7 K/uL   Lymphocytes Relative 17 %    Lymphs Abs 0.8 0.7 - 4.0 K/uL   Monocytes Relative 12 %   Monocytes Absolute 0.6 0.1 - 1.0 K/uL   Eosinophils Relative 4 %   Eosinophils Absolute 0.2 0.0 - 0.5 K/uL   Basophils Relative 0 %   Basophils Absolute 0.0 0.0 - 0.1 K/uL   Immature Granulocytes 0 %   Abs Immature Granulocytes 0.01 0.00 - 0.07 K/uL    Comment: Performed at [facility], [address], [place], [place] [address]  POCT I-Stat EG7     Status: Abnormal   Collection Time: 06/18/18  8:28 AM  Result Value Ref Range   pH, Ven 7.346 7.250 - 7.430   pCO2, Ven 83.9 (HH) 44.0 - 60.0 mmHg   pO2, Ven 44.0 32.0 - 45.0 mmHg   Bicarbonate 45.9 (H) 20.0 - 28.0 mmol/L   TCO2 48 (H) 22 - 32 mmol/L   O2 Saturation 74.0 %   Acid-Base Excess 16.0 (H) 0.0 - 2.0 mmol/L   Sodium 135 135 - 145 mmol/L   Potassium 4.6 3.5 - 5.1 mmol/L   Calcium, Ion 1.10 (L) 1.15 - 1.40 mmol/L   HCT 38.0 (L) 39.0 - 52.0 %   Hemoglobin 12.9 (L) 13.0 - 17.0 g/dL   Patient temperature HIDE    Sample type VENOUS    Comment NOTIFIED PHYSICIAN    Dg Chest Port 1 View  Result Date: 06/18/2018 CLINICAL DATA:  Fever and hypoxia. EXAM: PORTABLE CHEST 1 VIEW COMPARISON:  06/14/2018 FINDINGS: Cardiomediastinal silhouette is unchanged. Emphysema again noted. There is no evidence of focal airspace disease, pulmonary edema, suspicious pulmonary nodule/mass, pleural effusion, or pneumothorax. No acute bony abnormalities are identified. IMPRESSION: Emphysema without evidence of acute cardiopulmonary disease. Electronically Signed   By: Margarette Canada M.D.   On: 06/18/2018 08:49    Pending Labs Unresulted Labs (From admission, onward)    Start     Ordered   06/25/18 0500  Creatinine, serum  (enoxaparin (LOVENOX)    CrCl >/= 30 ml/min)  Weekly,   R    Comments:  while on enoxaparin therapy    06/18/18 1024   06/18/18 1025  Blood gas, arterial  Once,   R  06/18/18 1024   06/18/18 1025  Save Smear  Once,   R     06/18/18 1024   06/18/18 1024  CBC   (enoxaparin (LOVENOX)    CrCl >/= 30 ml/min)  Once,   R    Comments:  Baseline for enoxaparin therapy IF NOT ALREADY DRAWN.  Notify MD if PLT < 100 K.    06/18/18 1024   06/18/18 1024  Creatinine, serum  (enoxaparin (LOVENOX)    CrCl >/= 30 ml/min)  Once,   R    Comments:  Baseline for enoxaparin therapy IF NOT ALREADY DRAWN.    06/18/18 1024   06/18/18 1024  Procalcitonin - Baseline  ONCE - STAT,   STAT     06/18/18 1024   06/18/18 0814  Influenza panel by PCR (type A & B)  (Influenza PCR Panel)  Once,   R     06/18/18 0814   06/18/18 0813  Lactic acid, plasma  Now then every 2 hours,   STAT     06/18/18 0814   06/18/18 0813  Blood Culture (routine x 2)  BLOOD CULTURE X 2,   STAT     06/18/18 0814   06/18/18 0813  Urinalysis, Routine w reflex microscopic  ONCE - STAT,   STAT     06/18/18 0814          Vitals/Pain Today's Vitals   06/18/18 0849 06/18/18 0900 06/18/18 0913 06/18/18 0945  BP:  (!) 123/57  (!) 119/59  Pulse: (!) 102 (!) 107 97 92  Resp: 18 (!) 28 20 15   Temp:      TempSrc:      SpO2: 100% 100% 99% 98%  Weight:      Height:      PainSc:        Isolation Precautions Droplet precaution  Medications Medications  0.9 %  sodium chloride infusion (1,000 mLs Intravenous New Bag/Given 06/18/18 0943)  ceFEPIme (MAXIPIME) 1 g in sodium chloride 0.9 % 100 mL IVPB (has no administration in time range)  enoxaparin (LOVENOX) injection 40 mg (has no administration in time range)  sodium chloride flush (NS) 0.9 % injection 3 mL (has no administration in time range)  sodium chloride flush (NS) 0.9 % injection 3 mL (has no administration in time range)  sodium chloride flush (NS) 0.9 % injection 3 mL (has no administration in time range)  tiotropium (SPIRIVA) inhalation capsule ([facility] use ONLY) 18 mcg (has no administration in time range)  aspirin EC tablet 81 mg (has no administration in time range)  mirtazapine (REMERON) tablet 7.5 mg (has no administration in time range)   pantoprazole (PROTONIX) EC tablet 40 mg (has no administration in time range)  levETIRAcetam (KEPPRA) tablet 1,000 mg (has no administration in time range)  phenytoin (DILANTIN) ER capsule 100 mg (has no administration in time range)  Vitamin D3 CAPS 1,000 Units (has no administration in time range)  feeding supplement (ENSURE ENLIVE) (ENSURE ENLIVE) liquid 237 mL (has no administration in time range)  guaiFENesin (MUCINEX) 12 hr tablet 600 mg (has no administration in time range)  ipratropium-albuterol (DUONEB) 0.5-2.5 (3) MG/3ML nebulizer solution 3 mL (has no administration in time range)  ceFEPIme (MAXIPIME) 2 g in sodium chloride 0.9 % 100 mL IVPB (0 g Intravenous Stopped 06/18/18 0943)  ipratropium-albuterol (DUONEB) 0.5-2.5 (3) MG/3ML nebulizer solution 3 mL (3 mLs Nebulization Given 06/18/18 0911)    Mobility non-ambulatory High fall risk   Focused Assessments    R Recommendations:  See Admitting Provider Note

## 2018-06-18 NOTE — ED Notes (Signed)
Respiratory bedside obtaining ABG

## 2018-06-18 NOTE — ED Provider Notes (Signed)
[facility] EMERGENCY DEPARTMENT Provider Note   CSN: [id number] Arrival date & time: 06/18/18  0803    History   Chief Complaint Chief Complaint  Patient presents with  . Fever  . Weakness    HPI Brent [name] is a 85 y.o. male with history of COPD, seizures who presents with fever and hypoxia.  Patient was recently discharged from the hospital for COPD exacerbation and has been taking azithromycin.  He is on 2 L of chronic oxygen.  He was found by EMS to be saturating at 50%, however his tubing is about 20 feet long.  Patient was placed on nonrebreather and saturations went over 90%.  Patient has continued to have fever.  Patient's family reported the patient was fine last night and woke up weak this morning.  Patient returns with similar symptoms as 2 days ago.  Patient denies any chest pain, abdominal pain, vomiting.  Family reports patient's mental status has been at baseline.  Patient oriented to self.     The history is provided by the patient, the spouse, the EMS personnel and medical records.    Past Medical History:  Diagnosis Date  . Asthma   . Cancer ([place])    cancer  . Emphysema of lung ([place])   . Seizures [facility])     Patient Active Problem List   Diagnosis Date Noted  . Acute respiratory failure with hypoxia and hypercarbia ([place]) 06/15/2018  . COPD exacerbation ([place]) 06/14/2018  . Other pancytopenia ([place]) 06/14/2018  . Seizures ([place]) 06/14/2018  . Protein-calorie malnutrition, severe 09/15/2017  . Acute respiratory failure with hypoxia ([place]) 09/14/2017  . Recurrent seizures ([place]) 07/06/2017  . Hypertension 07/06/2017  . Hyperlipidemia, unspecified 07/06/2017  . COPD (chronic obstructive pulmonary disease) ([place]) 07/06/2017  . Chronic dementia, without behavioral disturbance ([place]) 09/11/2014  . Failure to thrive in adult 03/09/2014    History reviewed. No pertinent surgical history.      Home Medications    Prior to Admission  medications   Medication Sig Start Date End Date Taking? Authorizing Provider  acetaminophen (TYLENOL) 500 MG tablet Take 1,000 mg by mouth every 6 (six) hours as needed for mild pain.   Yes [provider]  albuterol (PROVENTIL) (2.5 MG/3ML) 0.083% nebulizer solution Take 3 mLs (2.5 mg total) by nebulization every 2 (two) hours as needed for wheezing or shortness of breath. 09/17/17  Yes Emokpae, Courage, MD  albuterol (VENTOLIN HFA) 108 (90 Base) MCG/ACT inhaler Inhale 2 puffs into the lungs every 4 (four) hours as needed for wheezing or shortness of breath. 09/17/17  Yes Emokpae, Courage, MD  aspirin EC 81 MG tablet Take 1 tablet (81 mg total) by mouth daily. With Breakfast 09/17/17  Yes Emokpae, Courage, MD  azithromycin (ZITHROMAX) 250 MG tablet Take 1 tablet (250 mg total) by mouth daily for 2 days. 06/16/18 06/18/18 Yes Isabelle Course, MD  Cholecalciferol (VITAMIN D3) 1000 units CAPS Take 1,000 Units by mouth daily at 12 noon.    Yes [provider]  feeding supplement, ENSURE ENLIVE, (ENSURE ENLIVE) LIQD Take 237 mLs by mouth 3 (three) times daily between meals. 09/17/17  Yes Emokpae, Courage, MD  furosemide (LASIX) 20 MG tablet Take 20 mg by mouth daily as needed for edema.   Yes [provider]  guaiFENesin (MUCINEX) 600 MG 12 hr tablet Take 1 tablet (600 mg total) by mouth 2 (two) times daily. Patient taking differently: Take 600 mg by mouth 2 (two) times daily as  needed for cough or to loosen phlegm.  09/17/17  Yes Emokpae, Courage, MD  levETIRAcetam (KEPPRA) 1000 MG tablet Take 1 tablet (1,000 mg total) by mouth 2 (two) times daily. 09/17/17 06/18/18 Yes Emokpae, Courage, MD  mirtazapine (REMERON) 7.5 MG tablet Take 1 tablet (7.5 mg total) by mouth at bedtime. For mood and appetite Patient taking differently: Take 7.5 mg by mouth every other day. For mood and appetite 09/17/17  Yes Emokpae, Courage, MD  omeprazole (PRILOSEC) 20 MG capsule Take 1 capsule (20 mg total) by  mouth daily. 09/17/17 09/17/18 Yes Emokpae, Courage, MD  phenytoin (DILANTIN) 100 MG ER capsule Take 1 capsule (100 mg total) by mouth 2 (two) times daily. 09/17/17  Yes Emokpae, Courage, MD  predniSONE (DELTASONE) 20 MG tablet Take 2 tablets (40 mg total) by mouth daily with breakfast for 2 days. 06/16/18 06/18/18 Yes Isabelle Course, MD  tiotropium (SPIRIVA HANDIHALER) 18 MCG inhalation capsule Place 1 capsule (18 mcg total) into inhaler and inhale daily. Patient not taking: Reported on 06/14/2018 09/17/17 09/17/18  Roxan Hockey, MD    Family History History reviewed. No pertinent family history.  Social History Social History   Tobacco Use  . Smoking status: Former Research scientist (life sciences)  . Smokeless tobacco: Never Used  Substance Use Topics  . Alcohol use: Not Currently  . Drug use: Never     Allergies   Patient has no known allergies.   Review of Systems Review of Systems  Constitutional: Positive for fatigue and fever. Negative for chills.  HENT: Negative for facial swelling and sore throat.   Respiratory: Positive for cough and shortness of breath.   Cardiovascular: Negative for chest pain.  Gastrointestinal: Negative for abdominal pain, nausea and vomiting.  Genitourinary: Negative for dysuria.  Musculoskeletal: Negative for back pain.  Skin: Negative for rash and wound.  Neurological: Positive for weakness. Negative for headaches.  Psychiatric/Behavioral: The patient is not nervous/anxious.      Physical Exam Updated Vital Signs BP (!) 119/59   Pulse 92   Temp 98.4 F (36.9 C) (Rectal)   Resp 15   Ht 5\' 6"  (1.676 m)   Wt 44.8 kg   SpO2 98%   BMI 15.94 kg/m   Physical Exam Vitals signs and nursing note reviewed.  Constitutional:      General: He is not in acute distress.    Appearance: He is well-developed. He is not diaphoretic.  HENT:     Head: Normocephalic and atraumatic.     Mouth/Throat:     Pharynx: No oropharyngeal exudate.  Eyes:     General: No scleral  icterus.       Right eye: No discharge.        Left eye: No discharge.     Conjunctiva/sclera: Conjunctivae normal.     Pupils: Pupils are equal, round, and reactive to light.  Neck:     Musculoskeletal: Normal range of motion and neck supple.     Thyroid: No thyromegaly.  Cardiovascular:     Rate and Rhythm: Normal rate and regular rhythm.     Heart sounds: Normal heart sounds. No murmur. No friction rub. No gallop.   Pulmonary:     Effort: Pulmonary effort is normal. No respiratory distress.     Breath sounds: Normal breath sounds. No stridor. No wheezing or rales.  Abdominal:     General: Bowel sounds are normal. There is no distension.     Palpations: Abdomen is soft.     Tenderness: There is  no abdominal tenderness. There is no guarding or rebound.  Lymphadenopathy:     Cervical: No cervical adenopathy.  Skin:    General: Skin is warm and dry.     Coloration: Skin is not pale.     Findings: No rash.  Neurological:     Mental Status: He is alert.     Coordination: Coordination normal.      ED Treatments / Results  Labs (all labs ordered are listed, but only abnormal results are displayed) Labs Reviewed  COMPREHENSIVE METABOLIC PANEL - Abnormal; Notable for the following components:      Result Value   Chloride 91 (*)    CO2 42 (*)    Glucose, Bld 107 (*)    Creatinine, Ser 0.59 (*)    All other components within normal limits  CBC WITH DIFFERENTIAL/PLATELET - Abnormal; Notable for the following components:   RBC 4.18 (*)    Hemoglobin 12.0 (*)    MCHC 29.4 (*)    Platelets 118 (*)    All other components within normal limits  POCT I-STAT EG7 - Abnormal; Notable for the following components:   pCO2, Ven 83.9 (*)    Bicarbonate 45.9 (*)    TCO2 48 (*)    Acid-Base Excess 16.0 (*)    Calcium, Ion 1.10 (*)    HCT 38.0 (*)    Hemoglobin 12.9 (*)    All other components within normal limits  CULTURE, BLOOD (ROUTINE X 2)  CULTURE, BLOOD (ROUTINE X 2)  LACTIC  ACID, PLASMA  LACTIC ACID, PLASMA  URINALYSIS, ROUTINE W REFLEX MICROSCOPIC  INFLUENZA PANEL BY PCR (TYPE A & B)  I-STAT VENOUS BLOOD GAS, ED    EKG EKG Interpretation  Date/Time:  Saturday June 18 2018 08:28:20 EST Ventricular Rate:  113 PR Interval:    QRS Duration: 89 QT Interval:  321 QTC Calculation: 441 R Axis:   73 Text Interpretation:  Sinus tachycardia Ventricular premature complex Biatrial enlargement Confirmed by Gerlene Fee [phone]) on 06/18/2018 8:37:34 AM Also confirmed by Gerlene Fee [phone]), editor Philomena Doheny [phone])  on 06/18/2018 9:37:00 AM   Radiology Dg Chest Port 1 View  Result Date: 06/18/2018 CLINICAL DATA:  Fever and hypoxia. EXAM: PORTABLE CHEST 1 VIEW COMPARISON:  06/14/2018 FINDINGS: Cardiomediastinal silhouette is unchanged. Emphysema again noted. There is no evidence of focal airspace disease, pulmonary edema, suspicious pulmonary nodule/mass, pleural effusion, or pneumothorax. No acute bony abnormalities are identified. IMPRESSION: Emphysema without evidence of acute cardiopulmonary disease. Electronically Signed   By: Margarette Canada M.D.   On: 06/18/2018 08:49    Procedures .Critical Care Performed by: Frederica Kuster, PA-C Authorized by: Frederica Kuster, PA-C   Critical care provider statement:    Critical care time (minutes):  45   Critical care was necessary to treat or prevent imminent or life-threatening deterioration of the following conditions:  Respiratory failure   Critical care was time spent personally by me on the following activities:  Discussions with consultants, evaluation of patient's response to treatment, examination of patient, ordering and performing treatments and interventions, ordering and review of laboratory studies, ordering and review of radiographic studies, pulse oximetry, re-evaluation of patient's condition, obtaining history from patient or surrogate and review of old charts   I assumed direction of critical  care for this patient from another provider in my specialty: yes     (including critical care time)  Medications Ordered in ED Medications  0.9 %  sodium chloride infusion (1,000 mLs Intravenous  New Bag/Given 06/18/18 0943)  vancomycin (VANCOCIN) IVPB 1000 mg/200 mL premix (0 mg Intravenous Hold 06/18/18 0941)  ceFEPIme (MAXIPIME) 1 g in sodium chloride 0.9 % 100 mL IVPB (has no administration in time range)  vancomycin (VANCOCIN) 1,250 mg in sodium chloride 0.9 % 250 mL IVPB (has no administration in time range)  ceFEPIme (MAXIPIME) 2 g in sodium chloride 0.9 % 100 mL IVPB (0 g Intravenous Stopped 06/18/18 0943)  ipratropium-albuterol (DUONEB) 0.5-2.5 (3) MG/3ML nebulizer solution 3 mL (3 mLs Nebulization Given 06/18/18 0911)     Initial Impression / Assessment and Plan / ED Course  I have reviewed the triage vital signs and the nursing notes.  Pertinent labs & imaging results that were available during my care of the patient were reviewed by me and considered in my medical decision making (see chart for details).        Patient presenting with hypoxia and weakness.  Patient presentation initially concerning for sepsis, code sepsis called and cefepime initiated.  Vancomycin held at this time considering lactic acid negative, patient afebrile, and no leukocytosis.  Blood cultures pending.  Chest x-ray shows emphysema without active cardiopulmonary disease.  PCO2 is 83.9, however venous pH is 7.35.  Suspect baseline PCO2 is elevated.  Patient placed on BiPAP considering increased work of breathing, hypoxia, and hypercapnia.  Patient's vitals have been stable.  Maintenance fluids initiated.  I discussed patient case with the internal medicine teaching service who accepts patient for admission.  We appreciate their assistance with the patient.  Patient also evaluated by my attending, Dr. Sedonia Small, who guided the patient's management and agrees with plan.  Final Clinical Impressions(s) / ED Diagnoses     Final diagnoses:  Acute respiratory failure with hypoxia and hypercapnia [facility])    ED Discharge Orders    None       Frederica Kuster, PA-C 06/18/18 1005    Maudie Flakes, MD 06/18/18 [phone]

## 2018-06-18 NOTE — Progress Notes (Signed)
ABG results given to Maudie Flakes, MD No new orders at this time.

## 2018-06-18 NOTE — ED Notes (Signed)
Notified EDP Dr. Sedonia Small of critical value: PCO2 83.9

## 2018-06-18 NOTE — ED Notes (Signed)
Respiratory bedside giving breathing tx via BiPap

## 2018-06-18 NOTE — ED Notes (Signed)
Xray tech at bedside.

## 2018-06-18 NOTE — Progress Notes (Signed)
Brent [name] [id number] Admission Data: 06/18/2018 1:56 PM Attending Provider: Oval Linsey, MD  KKD:[id number], Myriam Forehand, MD  [name] is a 85 y.o. male patient admitted from ED awake, alert  & orientated  X 3,  DNR, VSS - Blood pressure (!) 122/53, pulse (!) 101, temperature 97.7 F (36.5 C), temperature source Axillary, resp. rate 18, height 5\' 6"  (1.676 m), weight 44.8 kg, SpO2 99 %., O2 on BIPAP, no c/o shortness of breath, no c/o chest pain, no distress noted. Tele # [id number] placed and pt is currently running:normal sinus rhythm.  Pt orientation to unit, room and routine. Information packet given to patient/family and safety video watched.  Admission INP armband ID verified with patient/family, and in place. SR up x 2, fall risk assessment complete with Patient and family verbalizing understanding of risks associated with falls. Pt verbalizes an understanding of how to use the call bell and to call for help before getting out of bed.  Skin, clean-dry- intact without evidence of bruising, or skin tears.    Will cont to monitor and assist as needed.  Hiram Comber, RN 06/18/2018 1:56 PM

## 2018-06-18 NOTE — Progress Notes (Signed)
Pt transported by RN & RT from ED 31 to 5 W 07 without any complicatons.

## 2018-06-18 NOTE — Progress Notes (Signed)
Pharmacy Antibiotic Note  Brent [name] is a 85 y.o. male admitted on 06/18/2018 with lethargy, fever and weakness.  Patient was just discharged on 06/16/18 after treatment for COPD exacerbation and received Rocephin and azithromycin.  Pharmacy has been consulted for vancomycin and cefepime dosing for PNA.  SCr 0.59, CrCL 40 ml/min, afebrile, WBC 2.9.  Plan: Vanc 1gm IV x 1, then 1250mg  IV Q48H for AUC 519 using SCr 0.8 Cefepime 2gm IV x 1, then 1gm IV Q24H Monitor renal fxn, clinical progress, vanc levels as indicated   Height: 5\' 6"  (167.6 cm) Weight: 98 lb 12.3 oz (44.8 kg) IBW/kg (Calculated) : 63.8  Temp (24hrs), Avg:98.4 F (36.9 C), Min:98.4 F (36.9 C), Max:98.4 F (36.9 C)  Recent Labs  Lab 06/14/18 0906 06/14/18 0936 06/15/18 0302 06/15/18 1057 06/16/18 0255  WBC 3.9*  --  2.9* 3.3* 2.9*  CREATININE 0.67  --  0.57* 0.61  --   LATICACIDVEN  --  1.0  --   --   --     Estimated Creatinine Clearance: 39.7 mL/min (by C-G formula based on SCr of 0.61 mg/dL).    No Known Allergies  Vanc 2/22 >> Cefepime 2/22 >>  2/22 BCx -    Devony Mcgrady D. Mina Marble, PharmD, BCPS, [place] 06/18/2018, 9:55 AM

## 2018-06-18 NOTE — ED Notes (Signed)
Respiratory therapist at bedside applying Bipap.

## 2018-06-18 NOTE — ED Notes (Signed)
Attempted to give report to 5W x1; asked to call back in 5 minutes.

## 2018-06-18 NOTE — ED Triage Notes (Signed)
Pt arrives with [facility] c/o lethargy, fever, and weakness. Pt's family stated to EMS that pt was "fine" last night and woke up weak this morning. Pt was here in the ED for the same sxs; Pt recently dx'd with COPD. O2 sat 50% on RA; EMS applied non rebreather, sat up to 90%.  EMS vitals: BP 120/60 HR 100 18 RR CBG 128 Temp 100 F

## 2018-06-19 DIAGNOSIS — Z79899 Other long term (current) drug therapy: Secondary | ICD-10-CM

## 2018-06-19 DIAGNOSIS — Z7951 Long term (current) use of inhaled steroids: Secondary | ICD-10-CM

## 2018-06-19 DIAGNOSIS — F339 Major depressive disorder, recurrent, unspecified: Secondary | ICD-10-CM

## 2018-06-19 DIAGNOSIS — G40909 Epilepsy, unspecified, not intractable, without status epilepticus: Secondary | ICD-10-CM

## 2018-06-19 DIAGNOSIS — D72819 Decreased white blood cell count, unspecified: Secondary | ICD-10-CM

## 2018-06-19 DIAGNOSIS — Z9981 Dependence on supplemental oxygen: Secondary | ICD-10-CM

## 2018-06-19 DIAGNOSIS — J449 Chronic obstructive pulmonary disease, unspecified: Secondary | ICD-10-CM

## 2018-06-19 DIAGNOSIS — J9621 Acute and chronic respiratory failure with hypoxia: Principal | ICD-10-CM

## 2018-06-19 DIAGNOSIS — E43 Unspecified severe protein-calorie malnutrition: Secondary | ICD-10-CM | POA: Diagnosis present

## 2018-06-19 DIAGNOSIS — Z9911 Dependence on respirator [ventilator] status: Secondary | ICD-10-CM

## 2018-06-19 DIAGNOSIS — R634 Abnormal weight loss: Secondary | ICD-10-CM

## 2018-06-19 DIAGNOSIS — F0390 Unspecified dementia without behavioral disturbance: Secondary | ICD-10-CM

## 2018-06-19 DIAGNOSIS — R0689 Other abnormalities of breathing: Secondary | ICD-10-CM | POA: Diagnosis present

## 2018-06-19 DIAGNOSIS — J9622 Acute and chronic respiratory failure with hypercapnia: Secondary | ICD-10-CM

## 2018-06-19 LAB — CULTURE, BLOOD (ROUTINE X 2)
Culture: NO GROWTH
Culture: NO GROWTH
Special Requests: ADEQUATE

## 2018-06-19 LAB — CBC
HCT: 33.2 % — ABNORMAL LOW (ref 39.0–52.0)
Hemoglobin: 10 g/dL — ABNORMAL LOW (ref 13.0–17.0)
MCH: 28.2 pg (ref 26.0–34.0)
MCHC: 30.1 g/dL (ref 30.0–36.0)
MCV: 93.8 fL (ref 80.0–100.0)
Platelets: 110 10*3/uL — ABNORMAL LOW (ref 150–400)
RBC: 3.54 MIL/uL — ABNORMAL LOW (ref 4.22–5.81)
RDW: 11.9 % (ref 11.5–15.5)
WBC: 3.6 10*3/uL — ABNORMAL LOW (ref 4.0–10.5)
nRBC: 0 % (ref 0.0–0.2)

## 2018-06-19 MED ORDER — IPRATROPIUM-ALBUTEROL 0.5-2.5 (3) MG/3ML IN SOLN
3.0000 mL | Freq: Two times a day (BID) | RESPIRATORY_TRACT | Status: DC
  Administered 2018-06-19: 3 mL via RESPIRATORY_TRACT
  Filled 2018-06-19: qty 3

## 2018-06-19 MED ORDER — IPRATROPIUM-ALBUTEROL 0.5-2.5 (3) MG/3ML IN SOLN
3.0000 mL | Freq: Three times a day (TID) | RESPIRATORY_TRACT | Status: DC
  Administered 2018-06-19: 3 mL via RESPIRATORY_TRACT
  Filled 2018-06-19: qty 3

## 2018-06-19 NOTE — H&P (Signed)
Internal Medicine Attending Admission Note Date: 06/19/2018  Patient name: Brent [name] Medical record number: [id number] Date of birth: Jan 12, 1927 Age: 85 y.o. Gender: male  I saw and evaluated the patient. I reviewed the resident's note and I agree with the resident's findings and plan as documented in the resident's note.  Chief Complaint(s): Generalized weakness and hypoxemia x1 day.  History - key components related to admission:  Mr. Brent [name] is an 85 year old man with a history of chronic obstructive pulmonary disease requiring home oxygen therapy and marked by chronic ventilatory failure, depression, and seizures who presents with a 1 day history of generalized weakness and hypoxemia.  He was recently discharged from the internal medicine teaching service two days prior to admission where he was treated for a COPD exacerbation with overnight BiPAP, prednisone, and azithromycin.  His respiratory status quickly improved and he was discharged home feeling well and back to his baseline.  On the day of admission he awoke too weak to get out of bed.  EMS was called and reportedly found his room air O2 saturation to be 50% even though he was prescribed home oxygen.  It is unclear why he was not on the oxygen, but when placed on a partial nonrebreather his O2 saturation quickly increased into the 90s.  He was readmitted to the internal medicine teaching service for further evaluation and care.  Per Dr. Cristal Generous history prior to the recent admission he had not been admitted to the hospital for pulmonary problems.  Most of his admissions prior to moving to [facility] were related to his seizure disorder.  His wife also added that he has lost approximately 50 pounds over the last year.  When seen on rounds the morning after admission he was removed from his BiPAP therapy that he had been on overnight and stated his pulmonary symptoms were back to baseline.  Other than a dry mouth and a sore left lateral  tongue he was without other acute complaints.  Physical Exam - key components related to admission:  Vitals:   06/19/18 0514 06/19/18 0741 06/19/18 0910 06/19/18 1434  BP:   (!) 130/57   Pulse:  98 (!) 107   Resp:  16 14   Temp: 97.9 F (36.6 C)     TempSrc: Axillary     SpO2:  100% 91% 98%  Weight:      Height:       General: Well-developed cachectic man sitting comfortably in bed in no acute distress.  He is answering questions appropriately. HEENT: No visible lesions on the left lateral aspect of the tongue where he has pain. Lungs: Distant breath sounds with fair air movement and no wheezing.  All lung fields are otherwise clear to auscultation.  Lab results:  Basic Metabolic Panel: Recent Labs    06/18/18 0812 06/18/18 0828 06/18/18 1043  NA 139 135 136  K 4.6 4.6 4.1  CL 91*  --   --   CO2 42*  --   --   GLUCOSE 107*  --   --   BUN 13  --   --   CREATININE 0.59*  --   --   CALCIUM 8.9  --   --    Liver Function Tests: Recent Labs    06/18/18 0812  AST 28  ALT 14  ALKPHOS 73  BILITOT 0.3  PROT 7.1  ALBUMIN 3.6   CBC: Recent Labs    06/18/18 0812  06/18/18 1043 06/19/18 0436  WBC 4.7  --   --  3.6*  NEUTROABS 3.1  --   --   --   HGB 12.0*   < > 10.5* 10.0*  HCT 40.8   < > 31.0* 33.2*  MCV 97.6  --   --  93.8  PLT 118*  --   --  110*   < > = values in this interval not displayed.   Urinalysis:  Clear yellow, specific gravity 1.021, pH 7.0, nitrite negative, leukocytes negative  Misc. Labs:  ABG on unknown FiO2 7.39/74/62 Blood culture x2 no growth to date Lactic acid 0.7 -> 1.1 Influenza A negative Influenza B negative  Imaging results:   Portable chest x-ray: Personally reviewed.  Hyperinflated.  No significant changes from the previous chest x-ray on June 14, 2018.  Other results:  EKG: Personally reviewed.  Normal sinus tachycardia to 113 bpm, normal axis, normal intervals, inferior Q waves, no LVH by voltage, delayed R wave  progression, no ST or T wave changes.  Other than the tachycardia there is no significant change from the previous ECG on June 14, 2018.  Assessment & Plan by Problem:  Mr. Brent [name] is an 85 year old man with a history of chronic obstructive pulmonary disease requiring home oxygen therapy and marked by chronic ventilatory failure, depression, and seizures who presents with a 1 day history of generalized weakness and hypoxemia.  I am most concerned that the weakness and hypoxemia were related to a misunderstanding with regards to his oxygen or mechanical problem with delivering the oxygen as he was quite hypoxemic when EMS arrived.  I do not believe he has an acute COPD exacerbation as this was just treated.  In fact, he is finishing up his final day of prednisone today.  I am most concerned that his COPD has been suboptimally managed.  From my review of the chart it appears that he most recently has just been on albuterol.  Given the severity of his COPD he would benefit from the addition of tiotropium as well as a combination steroid and long acting beta agonist.  He should remain on his rescue albuterol.  It is hoped with a more optimal inhaler regimen his chronic obstructive pulmonary disease may stabilize and require less admissions.    I am also concerned about his 50 pound weight loss.  There are likely several possible causes to this, all of which suggest that he is frail and possibly nearing end-stage within several diseases.  First, there is some concern that he may have a hematologic malignancy based on a leukopenia and a prior blood smear that did have some problems with how it was prepared.  If he did have a hematologic malignancy I do not believe that he is a great candidate for aggressive therapy given his underlying lung disease, seizure disorder, and cachexia.  Nonetheless, a repeat blood smear will be evaluated to give a more clear picture as to the possibility this may be contributing to  his decline.  The Keppra is a medication that is used for his seizures and is known to cause anorexia.  Given the difficulty he previously had with his seizures, and the necessity of being on Keppra, if this is a prominent cause of his decreased weight it may be something that has to be tolerated in order to have medication to control his seizures.  He also has a history of depression and this could be playing a role in his weight loss, although the mirtazapine is the perfect medication for both depression and weight gain.  Finally, I am concerned that the weight loss may also be related to the caloric expenditure needed to breathe given his severe obstructive lung disease requiring oxygen and concomitantly representing a hypercarbic respiratory failure.  Generally, weight loss associated with severe obstructive disease is seen late in the natural history of chronic obstructive disease, and in the setting of a medication such as Keppra that decreases appetite, the weight loss can become quite precipitous.  1) Chronic obstructive pulmonary disease: We will continue the rescue albuterol.  We will start tiotropium 2 puffs daily and Advair Diskus 250 mcg/50 mcg 1 puff twice daily.  We will also continue his home oxygen therapy with a target O2 saturation of 90 to 92%.  It is hoped that with this intensified inhaler regimen his chronic obstructive pulmonary disease will be less symptomatic.  Prior to discharge we will have respiratory therapy teach him appropriate respiratory medication delivery techniques.  2) Leukopenia: We have repeated the blood smear and will review it with Dr. Beryle Beams in the morning.  3) Seizure disorder: As it is currently well controlled we will continue the Keppra and Dilantin.  4) Depression: We will continue the mirtazapine in hopes of improving his depressive symptoms and improving his appetite so that his weight begins to increase.  5) Disposition: I anticipate he will be  stable for discharge home tomorrow during the week when we can assure that the appropriate services are available and that he will have oxygen that will be applied appropriately.

## 2018-06-19 NOTE — Progress Notes (Signed)
Patient tolerating being off bipap. Weaned down to 2L [place]. Patient up in the chair x 1 staff assist. Tolerating regular diet. Denies any complaints. Will continue to monitor.   Hiram Comber, RN 06/19/2018 3:29 PM

## 2018-06-19 NOTE — Progress Notes (Signed)
   Subjective: Feels much better today. Breathing is back to baseline. Mouth is a little dry and sore after having the bipap on all night. He feels like his lungs are in pretty good shape and hasn't had much issues with his breathing in the past. We started to educate him on the advanced nature of his copd and how it is contributing to his weight loss. This seems to be new news to him. I would like to come back when family is present and continue this discussion.   Objective:  Vital signs in last 24 hours: Vitals:   06/19/18 0300 06/19/18 0514 06/19/18 0741 06/19/18 0910  BP:    (!) 130/57  Pulse: 94  98 (!) 107  Resp: 14  16 14   Temp:  97.9 F (36.6 C)    TempSrc:  Axillary    SpO2: 100%  100% 91%  Weight:      Height:       Gen: frail, cachetic gentleman. Laying in bed. Initially on bipap but transitioned to RA Pulm: Breathing comfortably on RA, CTAB  Assessment/Plan:  Active Problems:   Acute on chronic respiratory failure with hypoxia and hypercapnia (HCC)   89yoM with COPD on home O2, seizure disorder, dementia, and depression presenting with acute on chronic hypoxic, hypercapnic respiratory failure in the setting of one day of dry cough, low grade fever, and weakness.   Acute on Chronic hypoxic, hypercarbic respiratory failure: Improved overnight with bipap. Back to his baseline this morning. I do not think this is a copd exacerbation. It seems that something mechanical may have happened the night prior to presentation which caused his oxygen to decrease in the 50s. He had HH PT and RN arranged from previous hospital admission. I will make sure this is still valid. Unfortunately, it appears that he has advanced copd, requiring increased energy to breath, resulting in nutritional intake that is unable to keep up metabolic demands. Thus, he has lost 50lbs over the last year. We will continue to treat him symptomatically and start educating the patient and family on the progression  and prognosis of his lung disease.  - scheduled duonebs TID - mucinex prn  - supplemental O2 - bipap prn    Seizure Disorder: Well controlled. Continue home keppra and dilantin   Dispo: Anticipated discharge in approximately 1 day(s).   Isabelle Course, MD 06/19/2018, 12:23 PM Pager: [phone]

## 2018-06-20 DIAGNOSIS — E43 Unspecified severe protein-calorie malnutrition: Secondary | ICD-10-CM

## 2018-06-20 LAB — SAVE SMEAR(SSMR), FOR PROVIDER SLIDE REVIEW

## 2018-06-20 MED ORDER — UMECLIDINIUM BROMIDE 62.5 MCG/INH IN AEPB
1.0000 | INHALATION_SPRAY | Freq: Every day | RESPIRATORY_TRACT | Status: DC
  Administered 2018-06-20 – 2018-06-21 (×2): 1 via RESPIRATORY_TRACT
  Filled 2018-06-20: qty 7

## 2018-06-20 MED ORDER — FLUTICASONE FUROATE-VILANTEROL 200-25 MCG/INH IN AEPB
1.0000 | INHALATION_SPRAY | Freq: Every day | RESPIRATORY_TRACT | Status: DC
  Administered 2018-06-20 – 2018-06-21 (×2): 1 via RESPIRATORY_TRACT
  Filled 2018-06-20: qty 28

## 2018-06-20 MED ORDER — BUDESONIDE 0.5 MG/2ML IN SUSP: 0.5000 mg | Freq: Every day | RESPIRATORY_TRACT | 2 refills | Status: DC

## 2018-06-20 MED ORDER — IPRATROPIUM-ALBUTEROL 0.5-2.5 (3) MG/3ML IN SOLN: 3.0000 mL | RESPIRATORY_TRACT | 0 refills | Status: DC | PRN

## 2018-06-20 MED ORDER — ALBUTEROL SULFATE (2.5 MG/3ML) 0.083% IN NEBU: 2.5000 mg | INHALATION_SOLUTION | RESPIRATORY_TRACT | Status: DC | PRN

## 2018-06-20 MED ORDER — ADULT MULTIVITAMIN W/MINERALS CH
1.0000 | ORAL_TABLET | Freq: Every day | ORAL | Status: DC
  Administered 2018-06-20 – 2018-06-22 (×3): 1 via ORAL
  Filled 2018-06-20 (×3): qty 1

## 2018-06-20 MED ORDER — ARFORMOTEROL TARTRATE 15 MCG/2ML IN NEBU: 15.0000 ug | INHALATION_SOLUTION | Freq: Every day | RESPIRATORY_TRACT | 2 refills | Status: DC

## 2018-06-20 NOTE — Progress Notes (Signed)
Initial Nutrition Assessment  DOCUMENTATION CODES:   Severe malnutrition in context of chronic illness, Underweight  INTERVENTION:   - Downgrade diet to Dysphagia 2 for ease of chewing  - Continue Ensure Enlive po TID, each supplement provides 350 kcal and 20 grams of protein  - MVI with minerals daily  NUTRITION DIAGNOSIS:   Severe Malnutrition related to chronic illness (COPD, dementia) as evidenced by severe fat depletion, severe muscle depletion.  GOAL:   Patient will meet greater than or equal to 90% of their needs  MONITOR:   PO intake, Supplement acceptance, Weight trends  REASON FOR ASSESSMENT:   Malnutrition Screening Tool    ASSESSMENT:   85 year old male who presented to the ED on 2/22 with fever and weakness. PMH significant for COPD, seizures, HTN, HLD, dementia. Pt recently discharged from the hospital for COPD exacerbation. Pt admitted with acute on chronic hypoxic hypercarbic respiratory failure.  Spoke with pt at bedside. RN in room providing nursing care. Noted 90% completed breakfast meal tray and 100% completed Ensure Enlive at bedside.  Pt reports that he typically eats 2 meals daily at home and drinks 3 Ensure daily at home. Pt reports "I eat pretty good at home." Pt states that he very rarely eats snacks and drinks either water or Ensure. Reviewed dietary recall.  Breakfast: oatmeal, Ensure Lunch: none Dinner: egg salad sandwich  Pt believes that he has lost weight but is unsure of how much. Per H&P, pt has lost 50 lbs. Pt reports that his UBW is 145 lbs and that he last weighed this 1 year ago. Weight history in chart is very limited but indicates that pt's weight has been stable since 09/17/17 when he weighed 43.5 kg. Current weight 44.8 kg.  Pt would like to continue receiving Ensure Enlive supplements. Pt would also like diet to come pre-chopped and pre-ground for ease of chewing. Will also order MVI.  Medications reviewed and include:  cholecalciferol, Ensure Enlive TID, Remeron 7.5 mg daily, Protonix  Labs reviewed.  UOP: 625 ml x 24 hours  NUTRITION - FOCUSED PHYSICAL EXAM:    Most Recent Value  Orbital Region  Severe depletion  Upper Arm Region  Severe depletion  Thoracic and Lumbar Region  Severe depletion  Buccal Region  Moderate depletion  Temple Region  Severe depletion  Clavicle Bone Region  Severe depletion  Clavicle and Acromion Bone Region  Severe depletion  Scapular Bone Region  Severe depletion  Dorsal Hand  Moderate depletion  Patellar Region  Severe depletion  Anterior Thigh Region  Severe depletion  Posterior Calf Region  Severe depletion  Edema (RD Assessment)  Mild [BLE]  Hair  Reviewed  Eyes  Reviewed  Mouth  Reviewed [missing majority of teeth]  Skin  Reviewed  Nails  Reviewed       Diet Order:   Diet Order            DIET DYS 2 Room service appropriate? Yes; Fluid consistency: Thin  Diet effective now        Diet - low sodium heart healthy              EDUCATION NEEDS:   Not appropriate for education at this time  Skin:  Skin Assessment: Reviewed RN Assessment  Last BM:  PTA/unknown  Height:   Ht Readings from Last 1 Encounters:  06/18/18 5\' 6"  (1.676 m)    Weight:   Wt Readings from Last 1 Encounters:  06/18/18 44.8 kg    Ideal  Body Weight:  64.5 kg  BMI:  Body mass index is 15.94 kg/m.  Estimated Nutritional Needs:   Kcal:  1400-1600  Protein:  70-80 grams  Fluid:  1.4-1.6 L    Gaynell Face, MS, RD, LDN Inpatient Clinical Dietitian Pager: [phone] Weekend/After Hours: [phone]

## 2018-06-20 NOTE — Progress Notes (Signed)
  Date: 06/20/2018  Patient name: Brent [name]  Medical record number: [id number]  Date of birth: [birth year]   I have seen and evaluated this patient and I have discussed the plan of care with the house staff. Please see their note for complete details. I concur with their findings with the following additions/corrections:   Doing better today, feels mostly back to baseline. Difficult to target appropriate O2 rate as ideally would target sat of 88-92% so as not to suppress respiratory drive, but sat drops quickly with any exertion. On our exam, O2 sat 99% on 2L at rest, decreased to 0.5L and sat dropped to 85% with sitting up for lung exam. Lung sounds still distant with prolonged expiratory phase, very cachectic. Severe malnutrition is certainly playing a role in his weakness and difficulty functioning. He and his family feel he is too weak to go home today, we will continue to try to maximize nutrition while he is here and have him work with PT/OT. We will maximize his COPD therapy as well, but I suspect he is reaching end-stage disease and may not respond well to treatment.   Lenice Pressman, M.D., Ph.D. 06/20/2018, 2:34 PM

## 2018-06-20 NOTE — Evaluation (Signed)
Occupational Therapy Evaluation Patient Details Name: Brent [name] MRN: [id number] DOB: 16-Apr-1927 Today's Date: 06/20/2018    History of Present Illness 32yoM with COPD on home O2, seizure disorder, dementia, and depression presenting with acute on chronic hypoxic, hypercapnic respiratory failure in the setting of one day of dry cough, low grade fever, and weakness.    Clinical Impression   Brent [name] was admitted with above problem list and is now presenting with generalized weakness and decreased independence in ADLs/functional mobility. Per PT conversation with pt's wife, she was helping a great deal with the majority of ADLs and is comfortable taking him home to continue doing so. This session pt required mod A to side step at EOB. RN who completed functional mobility with pt earlier this AM reports he needed no more than min A in room. If wife is comfortable providing this level of care, d/c home with [facility]. However if not, pt SNF candidate. OT will continue to follow acutely.     Follow Up Recommendations  Home health OT;Supervision/Assistance - 24 hour    Equipment Recommendations  None recommended by OT    Recommendations for Other Services PT consult     Precautions / Restrictions Precautions Precautions: Fall Restrictions Weight Bearing Restrictions: No      Mobility Bed Mobility Overal bed mobility: Needs Assistance Bed Mobility: Rolling;Sidelying to Sit Rolling: Min guard Sidelying to sit: Min assist       General bed mobility comments: Pt transitioned to EOB with min A to power up  Transfers Overall transfer level: Needs assistance Equipment used: None Transfers: Sit to/from Stand Sit to Stand: Min assist;From elevated surface         General transfer comment: Pt took several lateral steps along side of bed, requiring max A to maintain balance    Balance Overall balance assessment: Needs assistance Sitting-balance support: Feet supported Sitting  balance-Leahy Scale: Fair     Standing balance support: Single extremity supported;During functional activity Standing balance-Leahy Scale: Poor Standing balance comment: Heavy support on therapist                            ADL either performed or assessed with clinical judgement   ADL Overall ADL's : Needs assistance/impaired     Grooming: Oral care;Wash/dry face;Wash/dry hands;Minimal assistance   Upper Body Bathing: Minimal assistance;Sitting   Lower Body Bathing: Moderate assistance;Cueing for safety;Sit to/from stand   Upper Body Dressing : Minimal assistance;Sitting   Lower Body Dressing: Sit to/from stand;Maximal assistance   Toilet Transfer: Moderate assistance;Stand-pivot   Toileting- Clothing Manipulation and Hygiene: Maximal assistance;Sit to/from stand       Functional mobility during ADLs: Maximal assistance       Vision Patient Visual Report: No change from baseline              Pertinent Vitals/Pain Pain Assessment: No/denies pain Faces Pain Scale: Hurts little more Pain Location: generalized Pain Descriptors / Indicators: Grimacing;Guarding Pain Intervention(s): Limited activity within patient's tolerance;Monitored during session;Repositioned     Hand Dominance Right   Extremity/Trunk Assessment Upper Extremity Assessment Upper Extremity Assessment: Generalized weakness(3+/5 overall)   Lower Extremity Assessment Lower Extremity Assessment: Generalized weakness   Cervical / Trunk Assessment Cervical / Trunk Assessment: Kyphotic   Communication Communication Communication: HOH   Cognition Arousal/Alertness: Awake/alert Behavior During Therapy: WFL for tasks assessed/performed Overall Cognitive Status: History of cognitive impairments - at baseline  Exercises Exercises: General Upper Extremity;General Lower Extremity General Exercises - Upper Extremity Shoulder Flexion:  AROM;Both;5 reps;Supine Elbow Flexion: AROM;Both;5 reps;Supine Elbow Extension: AROM;Both;5 reps;Supine General Exercises - Lower Extremity Ankle Circles/Pumps: AROM;Both;10 reps;Supine Quad Sets: AROM;Both;10 reps;Supine Heel Slides: AROM;Both;10 reps;Supine Hip ABduction/ADduction: AROM;Both;10 reps;Supine     Home Living Family/patient expects to be discharged to:: Private residence Living Arrangements: Spouse/significant other Available Help at Discharge: Family;Available 24 hours/day Type of Home: Apartment Home Access: Stairs to enter Entrance Stairs-Number of Steps: 3 Entrance Stairs-Rails: None Home Layout: One level     Bathroom Shower/Tub: Teacher, early years/pre: Standard Bathroom Accessibility: Yes   Home Equipment: Environmental consultant - 2 wheels;Wheelchair - Regulatory affairs officer - single point;Walker - 4 wheels          Prior Functioning/Environment Level of Independence: Needs assistance  Gait / Transfers Assistance Needed: household ambulation for last month, several steps with RW, becomes SOB.  ADL's / Homemaking Assistance Needed: wife assists showering, LB dressing and all IADL, has been sponge bathing   Comments: Information obtained from PT convo with pt's wife        OT Problem List: Decreased strength;Decreased range of motion;Decreased activity tolerance;Impaired balance (sitting and/or standing);Decreased safety awareness;Decreased cognition;Decreased coordination;Decreased knowledge of use of DME or AE;Decreased knowledge of precautions;Cardiopulmonary status limiting activity      OT Treatment/Interventions: Self-care/ADL training;Therapeutic exercise;DME and/or AE instruction;Balance training;Patient/family education;Therapeutic activities;Energy conservation    OT Goals(Current goals can be found in the care plan section) Acute Rehab OT Goals Patient Stated Goal: "go home." OT Goal Formulation: With patient Time For Goal Achievement:  06/25/18 Potential to Achieve Goals: Fair  OT Frequency: Min 2X/week              AM-PAC OT "6 Clicks" Daily Activity     Outcome Measure Help from another person eating meals?: A Little Help from another person taking care of personal grooming?: A Little Help from another person toileting, which includes using toliet, bedpan, or urinal?: A Lot Help from another person bathing (including washing, rinsing, drying)?: A Lot Help from another person to put on and taking off regular upper body clothing?: A Little Help from another person to put on and taking off regular lower body clothing?: A Lot 6 Click Score: 15   End of Session Equipment Utilized During Treatment: Gait belt;Oxygen Nurse Communication: Mobility status;Other (comment)(PLOF while here at hospital)  Activity Tolerance: Patient limited by fatigue Patient left: in bed;with call bell/phone within reach;with bed alarm set  OT Visit Diagnosis: Unsteadiness on feet (R26.81);Muscle weakness (generalized) (M62.81)                Time: 0300-9233 OT Time Calculation (min): 16 min Charges:  OT General Charges $OT Visit: 1 Visit OT Evaluation $OT Eval Moderate Complexity: Lapel OTR/L  06/20/2018, 4:48 PM

## 2018-06-20 NOTE — Discharge Summary (Addendum)
Name: Brent [name] MRN: [id number] DOB: March 22, 1927 85 y.o. PCP: Chase Picket, MD  Date of Admission: 06/18/2018  8:03 AM Date of Discharge: 06/22/18 Attending Physician: Oda Kilts, MD  Discharge Diagnosis: 1. COPD 2. Malnutrition 3. Seizure disorder  Discharge Medications: Allergies as of 06/22/2018   No Known Allergies     Medication List    STOP taking these medications   albuterol (2.5 MG/3ML) 0.083% nebulizer solution Commonly known as:  PROVENTIL   albuterol 108 (90 Base) MCG/ACT inhaler Commonly known as:  VENTOLIN HFA   azithromycin 250 MG tablet Commonly known as:  ZITHROMAX   predniSONE 20 MG tablet Commonly known as:  DELTASONE   tiotropium 18 MCG inhalation capsule Commonly known as:  SPIRIVA HANDIHALER     TAKE these medications   acetaminophen 500 MG tablet Commonly known as:  TYLENOL Take 1,000 mg by mouth every 6 (six) hours as needed for mild pain.   arformoterol 15 MCG/2ML Nebu Commonly known as:  BROVANA Take 2 mLs (15 mcg total) by nebulization daily.   aspirin EC 81 MG tablet Take 1 tablet (81 mg total) by mouth daily. With Breakfast   budesonide 0.5 MG/2ML nebulizer solution Commonly known as:  PULMICORT Take 2 mLs (0.5 mg total) by nebulization daily.   feeding supplement (ENSURE ENLIVE) Liqd Take 237 mLs by mouth 3 (three) times daily between meals.   furosemide 20 MG tablet Commonly known as:  LASIX Take 20 mg by mouth daily as needed for edema.   guaiFENesin 600 MG 12 hr tablet Commonly known as:  MUCINEX Take 1 tablet (600 mg total) by mouth 2 (two) times daily. What changed:    when to take this  reasons to take this   ipratropium-albuterol 0.5-2.5 (3) MG/3ML Soln Commonly known as:  DUONEB Take 3 mLs by nebulization every 4 (four) hours as needed.   levETIRAcetam 1000 MG tablet Commonly known as:  KEPPRA Take 1 tablet (1,000 mg total) by mouth 2 (two) times daily.   mirtazapine 7.5 MG  tablet Commonly known as:  REMERON Take 1 tablet (7.5 mg total) by mouth at bedtime. For mood and appetite What changed:  when to take this   multivitamin with minerals Tabs tablet Take 1 tablet by mouth daily. Start taking on:  June 23, 2018   omeprazole 20 MG capsule Commonly known as:  PRILOSEC Take 1 capsule (20 mg total) by mouth daily.   phenytoin 100 MG ER capsule Commonly known as:  DILANTIN Take 1 capsule (100 mg total) by mouth 2 (two) times daily.   polyethylene glycol packet Commonly known as:  MIRALAX / GLYCOLAX Take 17 g by mouth daily as needed for mild constipation.   Vitamin D3 25 MCG (1000 UT) Caps Take 1,000 Units by mouth daily at 12 noon.            Durable Medical Equipment  (From admission, onward)         Start     Ordered   06/20/18 0000  DME Nebulizer machine    Question:  Patient needs a nebulizer to treat with the following condition  Answer:  COPD (chronic obstructive pulmonary disease) ([facility])   06/20/18 1019          Disposition and follow-up:   Brent [name] was discharged from [facility] in Stable condition.  At the hospital follow up visit please address:  1.  COPD:  - Discharged with nebulized LABA, ICS, and duoneb solution. He  will need home  nebulizer machine.   - Ensure he is using LABA and ICS once daily every day  - Discontinued albuterol inhaler. Eval by inpatient RT showed that he does not have  adequate inspiratory drive to successful get inhaled medicine into his lungs. He is  better off using nebulized solutions    Malnutrition: - Continue family education and move towards palliative care. Concern for end stage COPD given 50lb weight loss over the last year. I introduced this to the family. The daughter was very much on board.  - Palliative Care evaluated him inpatient and will follow him outpatient as well   2.  Labs / imaging needed at time of follow-up: none  3.  Pending labs/ test needing  follow-up: none  Follow-up Appointments:  Contact information for follow-up providers    [facility]. Call in 1 week(s).   Contact information: [place] [address] [phone]       Clovia Cuff, MD Follow up.   Specialty:  Internal Medicine Why:  You primary doctor will call you to schedule an appointment. I called their office and updated them on the plan Contact information: [address] [place] [address] [phone]            Contact information for after-discharge care    [facility] .   Service:  Skilled Nursing Contact information: [phone]                Instructed to schedule close PCP f/u   Hospital Course by problem list: [phone] with COPD on home O2, seizure disorder, dementia, and depression presenting withacute on chronic hypoxic, hypercapnic respiratory failure in the setting ofone day of dry cough, low grade fever, and weakness.  Acute on Chronic hypoxic, hypercarbic respiratory failure: oxygen in the 50s on initial eval by EMS. Quickly improved with NRB. Was put on bipap overnight for increased wob and was back to his baseline the following morning. I do not think this was a copd exacerbation. It seems that something mechanical may have happened the night prior to presentation which caused his oxygen to decrease in the 50s. Unfortunately, it appears that he has advanced copd, requiring increased energy to breath, resulting in nutritional intake that is unable to keep up metabolic demands. Thus, he has lost 50lbs over the last year. He has already been started on mirtazapine to help stimulate appetite. We will continue to treat him symptomatically and start educating the patient and family on the progression and prognosis of his lung disease. It appears that his COPD has been suboptimally  managed as he only uses albuterol inhaler prn at home. We discharged him with nebulizer machine and prescriptions for nebulized LABA and ICS and duoneb solutions. He is too weak for inhalers to be used effectively.   He was discharged to SNF for rehab. However, discussions were had with family about home hospice or palliative care resources if he does not significantly improve during short term rehab. Palliative care consult was arranged prior to discharge.   Pancytopenia: Chronic and stable. On further review of fresh blood smear, there were no abnormal appearing cells.   Seizure Disorder: Well controlled. Continued home keppra and dilantin  Discharge Vitals:   BP 127/65 (BP Location: Right Arm)   Pulse (!) 120   Temp 98.1 F (36.7 C) (Oral)   Resp 14  Ht 5\' 6"  (1.676 m)   Wt 44.8 kg   SpO2 100%   BMI 15.94 kg/m   Pertinent Labs, Studies, and Procedures:   ABG on unknown FiO2 7.39/74/62 Blood culture x2 no growth to date Lactic acid 0.7 -> 1.1 Influenza A negative Influenza B negative  CBC Latest Ref Rng & Units 06/21/2018 06/19/2018 06/18/2018  WBC 4.0 - 10.5 K/uL 3.3(L) 3.6(L) -  Hemoglobin 13.0 - 17.0 g/dL 11.2(L) 10.0(L) 10.5(L)  Hematocrit 39.0 - 52.0 % 36.9(L) 33.2(L) 31.0(L)  Platelets 150 - 400 K/uL 142(L) 110(L) -   CXR: - hyperinflated, no significant change from recent prior    Discharge Instructions: Discharge Instructions    DME Nebulizer machine   Complete by:  As directed    Patient needs a nebulizer to treat with the following condition:  COPD (chronic obstructive pulmonary disease) (HCC)   Diet - low sodium heart healthy   Complete by:  As directed    Discharge instructions   Complete by:  As directed    Brent [name], Brent [name] were admitted to the hospital because your oxygen level got really low. I wonder if something happened with your oxygen tank at home. We did not find anything else wrong with you that could have made your symptoms worse. I'm glad you  got better quickly. We were unable to find anything in particular that is causing your weakness. I'm worried that it is due to chronic malnutrition. Hopefully you will get stronger at rehab. They will help arrange any other resources you may need at home after you are done with rehab.    For you COPD: - I am starting you on some new medications. Usually, people take these medications through an inhaler but I am giving them to you with a nebulizer machine. - I think you will be better off using a nebulizer machine instead of an inhaler. This way, the medicine will get into your lungs better.  - There will be two medicines that you will use in your machine every day. Please use each medicine (arformoterol and budesonide) one time every day.  - I am also prescribing albuterol nebulizer solution. Use this medicine as needed for worsening shortness of breath.   Please schedule a follow up appointment with your primary care doctor so he/she can follow up on these new medications. If you don't get a chance to speak with the Palliative Care doctors, I will provide their contact info in your discharge paperwork so your family can reach out to them.   If you notice worsening shortness of breath or increased cough or fevers, please come back to the hospita   Increase activity slowly   Complete by:  As directed       Signed: Isabelle Course, MD 06/22/2018, 1:06 PM   Pager: [phone]

## 2018-06-20 NOTE — Progress Notes (Signed)
SATURATION QUALIFICATIONS:  Patient Saturations on Room Air at Rest 88%  Patient Saturations on [employer] while Ambulating: 85%  Patient saturation on 2 Liters at Rest: 98%  Patient Saturations on 2 Liters of oxygen while Ambulating: 94%

## 2018-06-20 NOTE — Progress Notes (Addendum)
   Subjective: Feels great today. Slept very well on 2L [place]. Discussed plan to start two new inhalers. Inhaler teaching will be provided upon administration. Will also make sure HH is still arranged from last admission. Plan to discharge today  Objective:  Vital signs in last 24 hours: Vitals:   06/19/18 1434 06/19/18 2011 06/19/18 2249 06/20/18 0426  BP:   135/63 (!) 143/65  Pulse:   96 99  Resp:   15 14  Temp:   98.9 F (37.2 C) 98.3 F (36.8 C)  TempSrc:   Oral Oral  SpO2: 98% 96% 98% 99%  Weight:      Height:       Gen: frail, cachetic gentleman. Laying in bed. Pulm: Breathing comfortably on 2L [place], CTAB. Tried to wean oxygen but saturations dropped to 88 on RA  Assessment/Plan:  Active Problems:   Acute on chronic respiratory failure with hypoxia and hypercapnia (HCC)   Ventilatory failure   Seizure disorder (HCC)   Episode of recurrent major depressive disorder (HCC)   Severe protein-calorie malnutrition ([place])   79yoM with COPD on home O2, seizure disorder, dementia, and depression presenting with acute on chronic hypoxic, hypercapnic respiratory failure in the setting of one day of dry cough, low grade fever, and weakness.   Acute on Chronic hypoxic, hypercarbic respiratory failure: Did well on home 2L [place] overnight. Oxygen maintains in the high 90s at rest but dropped to 88% when he leaned forward. I suspect he does not need oxygen at rest but encouraged him to use it with movement. He has been wearing it 24/7 at home. He has also only been using albuterol at home so we will add on a LAMA/LABA and ICS. I will speak with the nurse to ensure proper inhaler teaching is given by RT - start umeclidinium bromide 1 puff daily - start fluticasone vilanterol 1 puff daily  - albuterol prn sob  - mucinex prn  - supplemental O2  Seizure Disorder: Well controlled. Continue home keppra and dilantin   Dispo: Anticipated discharge today. Will make sure HH PT RN is still set up from  last hospitalization. Will also try to get in touch with family and discuss dispo plans.   Isabelle Course, MD 06/20/2018, 8:42 AM Pager: [phone]

## 2018-06-20 NOTE — Evaluation (Signed)
Physical Therapy Evaluation Patient Details Name: Brent [name] MRN: [id number] DOB: 1927/04/17 Today's Date: 06/20/2018   History of Present Illness  30yoM with COPD on home O2, seizure disorder, dementia, and depression presenting with acute on chronic hypoxic, hypercapnic respiratory failure in the setting of one day of dry cough, low grade fever, and weakness.   Clinical Impression  Pt admitted with above diagnosis. Pt currently with functional limitations due to the deficits listed below (see PT Problem List). Pt was able to ambulate earlier with nursing to door and O2 sats were measured. Pt feels very weak per pt.  He agreed to therapy however when attempting to come to EOB, pt decided he did not want to get up and laid back down.  Pt did perform some exercises with PT.  Called wife to update her and to confirm pts home situation and equipment. Pt wife states he does have walkers, cane and wheelchair.  She also confirms that she can assist him at all times and wants him to come home with [facility] services.  Recommend services below.  Will follow acutely.  Pt will benefit from skilled PT to increase their independence and safety with mobility to allow discharge to the venue listed below.     Follow Up Recommendations Home health PT;Supervision for mobility/OOB(HHOT, [facility])    Equipment Recommendations  None recommended by PT    Recommendations for Other Services       Precautions / Restrictions Precautions Precautions: Fall Restrictions Weight Bearing Restrictions: No      Mobility  Bed Mobility Overal bed mobility: Needs Assistance Bed Mobility: Rolling;Sidelying to Sit Rolling: Min guard Sidelying to sit: Min assist       General bed mobility comments: Attempted to sit up and pt stated he is too weak and laid back down  Transfers                 General transfer comment: Declined OOB  Ambulation/Gait                Stairs            Wheelchair Mobility    Modified Rankin (Stroke Patients Only)       Balance                                             Pertinent Vitals/Pain Pain Assessment: Faces Faces Pain Scale: Hurts little more Pain Location: generalized Pain Descriptors / Indicators: Grimacing;Guarding Pain Intervention(s): Limited activity within patient's tolerance;Monitored during session;Repositioned    Home Living Family/patient expects to be discharged to:: Private residence Living Arrangements: Spouse/significant other Available Help at Discharge: Family;Available 24 hours/day Type of Home: Apartment Home Access: Stairs to enter Entrance Stairs-Rails: None Entrance Stairs-Number of Steps: 3 Home Layout: One level Home Equipment: Walker - 2 wheels;Wheelchair - Regulatory affairs officer - single point;Walker - 4 wheels(home O2)      Prior Function Level of Independence: Needs assistance   Gait / Transfers Assistance Needed: household ambulation for last month, several steps with RW, becomes SOB.   ADL's / Homemaking Assistance Needed: wife assists showering, LB dressing and all IADL, has been sponge bathing        Hand Dominance   Dominant Hand: Right    Extremity/Trunk Assessment   Upper Extremity Assessment Upper Extremity Assessment: Defer to OT evaluation    Lower Extremity Assessment Lower Extremity  Assessment: Generalized weakness    Cervical / Trunk Assessment Cervical / Trunk Assessment: Kyphotic  Communication   Communication: HOH  Cognition Arousal/Alertness: Awake/alert Behavior During Therapy: WFL for tasks assessed/performed Overall Cognitive Status: History of cognitive impairments - at baseline                                        General Comments      Exercises General Exercises - Upper Extremity Shoulder Flexion: AROM;Both;5 reps;Supine Elbow Flexion: AROM;Both;5 reps;Supine Elbow Extension: AROM;Both;5 reps;Supine General Exercises -  Lower Extremity Ankle Circles/Pumps: AROM;Both;10 reps;Supine Quad Sets: AROM;Both;10 reps;Supine Heel Slides: AROM;Both;10 reps;Supine Hip ABduction/ADduction: AROM;Both;10 reps;Supine   Assessment/Plan    PT Assessment Patient needs continued PT services  PT Problem List Decreased strength;Decreased activity tolerance;Decreased balance;Decreased mobility;Cardiopulmonary status limiting activity;Decreased safety awareness;Decreased knowledge of use of DME       PT Treatment Interventions DME instruction;Gait training;Stair training;Functional mobility training;Therapeutic activities;Therapeutic exercise;Balance training;Patient/family education    PT Goals (Current goals can be found in the Care Plan section)  Acute Rehab PT Goals Patient Stated Goal: "go home." PT Goal Formulation: With patient Time For Goal Achievement: 07/04/18 Potential to Achieve Goals: Good    Frequency Min 3X/week   Barriers to discharge Decreased caregiver support      Co-evaluation               AM-PAC PT "6 Clicks" Mobility  Outcome Measure Help needed turning from your back to your side while in a flat bed without using bedrails?: None Help needed moving from lying on your back to sitting on the side of a flat bed without using bedrails?: A Little Help needed moving to and from a bed to a chair (including a wheelchair)?: A Little Help needed standing up from a chair using your arms (e.g., wheelchair or bedside chair)?: Total Help needed to walk in hospital room?: Total Help needed climbing 3-5 steps with a railing? : Total 6 Click Score: 13    End of Session Equipment Utilized During Treatment: Gait belt;Oxygen Activity Tolerance: Patient limited by fatigue Patient left: in bed;with bed alarm set;with call bell/phone within reach Nurse Communication: Mobility status PT Visit Diagnosis: Unsteadiness on feet (R26.81);Muscle weakness (generalized) (M62.81);Difficulty in walking, not  elsewhere classified (R26.2)    Time: 8891-6945 PT Time Calculation (min) (ACUTE ONLY): 11 min   Charges:   PT Evaluation $PT Eval Moderate Complexity: [place] Pager:  [phone]  Office:  [phone]    Denice Paradise 06/20/2018, 3:43 PM

## 2018-06-20 NOTE — Progress Notes (Signed)
Notified Doctor that pt as well as family does not feel comfortable with him being discharged today. Pt states he feels weak. Vitals are all within range. Will continue to monitor.

## 2018-06-21 DIAGNOSIS — M79671 Pain in right foot: Secondary | ICD-10-CM

## 2018-06-21 DIAGNOSIS — R531 Weakness: Secondary | ICD-10-CM

## 2018-06-21 LAB — COMPREHENSIVE METABOLIC PANEL
ALT: 14 U/L (ref 0–44)
AST: 25 U/L (ref 15–41)
Albumin: 3.2 g/dL — ABNORMAL LOW (ref 3.5–5.0)
Alkaline Phosphatase: 66 U/L (ref 38–126)
Anion gap: 7 (ref 5–15)
BUN: 12 mg/dL (ref 8–23)
CO2: 42 mmol/L — ABNORMAL HIGH (ref 22–32)
Calcium: 9 mg/dL (ref 8.9–10.3)
Chloride: 89 mmol/L — ABNORMAL LOW (ref 98–111)
Creatinine, Ser: 0.61 mg/dL (ref 0.61–1.24)
GFR calc Af Amer: >60 mL/min (ref >60)
GFR calc non Af Amer: >60 mL/min (ref >60)
Glucose, Bld: 96 mg/dL (ref 70–99)
Potassium: 4.6 mmol/L (ref 3.5–5.1)
Sodium: 138 mmol/L (ref 135–145)
Total Bilirubin: 0.4 mg/dL (ref 0.3–1.2)
Total Protein: 6.7 g/dL (ref 6.5–8.1)

## 2018-06-21 LAB — CBC
HCT: 36.9 % — ABNORMAL LOW (ref 39.0–52.0)
Hemoglobin: 11.2 g/dL — ABNORMAL LOW (ref 13.0–17.0)
MCH: 28.2 pg (ref 26.0–34.0)
MCHC: 30.4 g/dL (ref 30.0–36.0)
MCV: 92.9 fL (ref 80.0–100.0)
Platelets: 142 10*3/uL — ABNORMAL LOW (ref 150–400)
RBC: 3.97 MIL/uL — ABNORMAL LOW (ref 4.22–5.81)
RDW: 12 % (ref 11.5–15.5)
WBC: 3.3 10*3/uL — ABNORMAL LOW (ref 4.0–10.5)
nRBC: 0 % (ref 0.0–0.2)

## 2018-06-21 LAB — CK: Total CK: 47 U/L — ABNORMAL LOW (ref 49–397)

## 2018-06-21 LAB — C-REACTIVE PROTEIN: CRP: 7.4 mg/dL — ABNORMAL HIGH (ref <1.0)

## 2018-06-21 LAB — TROPONIN I: Troponin I: <0.03 ng/mL (ref <0.03)

## 2018-06-21 LAB — SEDIMENTATION RATE: Sed Rate: 50 mm/hr — ABNORMAL HIGH (ref 0–16)

## 2018-06-21 MED ORDER — ARFORMOTEROL TARTRATE 15 MCG/2ML IN NEBU
15.0000 ug | INHALATION_SOLUTION | Freq: Two times a day (BID) | RESPIRATORY_TRACT | Status: DC
  Administered 2018-06-21 – 2018-06-22 (×2): 15 ug via RESPIRATORY_TRACT
  Filled 2018-06-21 (×2): qty 2

## 2018-06-21 MED ORDER — BUDESONIDE 0.25 MG/2ML IN SUSP
0.2500 mg | Freq: Two times a day (BID) | RESPIRATORY_TRACT | Status: DC
  Administered 2018-06-21 – 2018-06-22 (×2): 0.25 mg via RESPIRATORY_TRACT
  Filled 2018-06-21 (×2): qty 2

## 2018-06-21 NOTE — Clinical Social Work Note (Signed)
Clinical Social Work Assessment  Patient Details  Name: Brent [name] MRN: [id number] Date of Birth: 07/07/1926  Date of referral:  06/21/18               Reason for consult:  Facility Placement                Permission sought to share information with:  Facility Sport and exercise psychologist, Family Supports Permission granted to share information::     Name::     Statistician::  SNFs  Relationship::  Spouse  Contact Information:  [phone]  Housing/Transportation Living arrangements for the past 2 months:  Apartment Source of Information:  Spouse, Patient Patient Interpreter Needed:  None Criminal Activity/Legal Involvement Pertinent to Current Situation/Hospitalization:  No - Comment as needed Significant Relationships:  Spouse, Adult Children Lives with:  Spouse Do you feel safe going back to the place where you live?  No Need for family participation in patient care:  Yes (Comment)  Care giving concerns:  CSW received consult for possible SNF placement at time of discharge. CSW spoke with patient and spouse regarding spouse recommendation of SNF placement at time of discharge. Patient's spouse reported that patient's spouse is currently unable to care for patient at their home given patient's current physical needs and fall risk. Patient expressed understanding of PT recommendation and is agreeable to SNF placement at time of discharge. CSW to continue to follow and assist with discharge planning needs.   Social Worker assessment / plan:  CSW spoke with patient's spouse concerning possibility of rehab at [facility] before returning home.  Employment status:  Retired Nurse, adult PT Recommendations:  Home with [place] / Referral to community resources:  [place]  Patient/Family's Response to care:  Patient recognizes need for rehab before returning home and is agreeable to a SNF in [place]. CSW will send out referral  and provide SNF list.   Patient/Family's Understanding of and Emotional Response to Diagnosis, Current Treatment, and Prognosis:  Patient/family is realistic regarding therapy needs and expressed being hopeful for SNF placement. Patient's spouse expressed understanding of CSW role and discharge process as well as medical condition. No questions/concerns about plan or treatment.    Emotional Assessment Appearance:  Appears stated age Attitude/Demeanor/Rapport:  Engaged Affect (typically observed):  Accepting Orientation:  Oriented to Self, Oriented to Place, Oriented to  Time, Oriented to Situation Alcohol / Substance use:  Not Applicable Psych involvement (Current and /or in the community):  No (Comment)  Discharge Needs  Concerns to be addressed:  Care Coordination Readmission within the last 30 days:  Yes Current discharge risk:  Dependent with Mobility Barriers to Discharge:  Continued Medical Work up   [employer], LCSW 06/21/2018, 5:11 PM

## 2018-06-21 NOTE — Progress Notes (Addendum)
Physical Therapy Treatment Patient Details Name: Brent [name] MRN: [id number] DOB: Aug 27, 1926 Today's Date: 06/21/2018    History of Present Illness 63yoM with COPD on home O2, seizure disorder, dementia, and depression presenting with acute on chronic hypoxic, hypercapnic respiratory failure in the setting of one day of dry cough, low grade fever, and weakness.     PT Comments    Pt admitted with above diagnosis. Pt currently with functional limitations due to the deficits listed below (see PT Problem List). Pt was able to ambulate in room with RW with min guard to min assist with 3LO2 in place with sats >90%.  Pt mainly needs cues to stay close to rW and to steer RW.  Pt states that he thinks his wife can assist at home. Wife told this PT via phone yesterday that she can assist pt as well with [place] services in home.  Feel that pt would benefit from SNF to get stronger but pt is refusing this and yesterday wife did not want pt to go to SNF either.  If they refuse SNF, recommend max HH services that pt can receive.   Pt will benefit from skilled PT to increase their independence and safety with mobility to allow discharge to the venue listed below.     Follow Up Recommendations  Home health PT;Supervision for mobility/OOB(HHOT, [facility]N, [facility]; if wife cant provide 24hr min assist, SNF)     Equipment Recommendations  None recommended by PT    Recommendations for Other Services       Precautions / Restrictions Precautions Precautions: Fall Restrictions Weight Bearing Restrictions: No    Mobility  Bed Mobility Overal bed mobility: Needs Assistance Bed Mobility: Rolling;Sidelying to Sit Rolling: Supervision Sidelying to sit: Supervision       General bed mobility comments: Pt transitioned to EOB without physical assist but did so slowly.   Transfers Overall transfer level: Needs assistance Equipment used: Rolling walker (2 wheeled) Transfers: Sit to/from Stand Sit to Stand:  Supervision;Min guard         General transfer comment: Pt did not need assist to stand to RW.  Did need cues to stand tall as he tends to flex forward.    Ambulation/Gait Ambulation/Gait assistance: Min assist;Min guard Gait Distance (Feet): 30 Feet Assistive device: Rolling walker (2 wheeled) Gait Pattern/deviations: Step-through pattern;Decreased stride length;Trunk flexed;Decreased step length - right;Decreased step length - left;Wide base of support;Drifts right/left   Gait velocity interpretation: <1.31 ft/sec, indicative of household ambulator General Gait Details: slow speed, pt needs assist and cues to keep RW in close proximity to body as well as cues to stand tall.  Also needed assist to steer RW at times.  When asked if wife can provide this assist, pt states "I think she can but she is feeble too".  Wife told this PT yesterday via phone she wants pt to come home with [facility] services.  Did issue gait belt to pt as well    Stairs             Wheelchair Mobility    Modified Rankin (Stroke Patients Only)       Balance Overall balance assessment: Needs assistance Sitting-balance support: Feet supported;No upper extremity supported Sitting balance-Leahy Scale: Fair     Standing balance support: During functional activity;Bilateral upper extremity supported Standing balance-Leahy Scale: Poor Standing balance comment: Needs RW for balance as well as therapist for cuing.  Cognition Arousal/Alertness: Awake/alert Behavior During Therapy: WFL for tasks assessed/performed Overall Cognitive Status: History of cognitive impairments - at baseline                                        Exercises General Exercises - Lower Extremity Ankle Circles/Pumps: AROM;Both;10 reps;Supine Quad Sets: AROM;Both;10 reps;Supine Heel Slides: AROM;Both;10 reps;Supine Hip ABduction/ADduction: AROM;Both;10 reps;Supine    General  Comments General comments (skin integrity, edema, etc.): Floated heels on pillow as pt states his right heel is sore      Pertinent Vitals/Pain Pain Assessment: No/denies pain    Home Living                      Prior Function            PT Goals (current goals can now be found in the care plan section) Acute Rehab PT Goals Patient Stated Goal: "go home." Progress towards PT goals: Progressing toward goals    Frequency    Min 3X/week      PT Plan Current plan remains appropriate    Co-evaluation              AM-PAC PT "6 Clicks" Mobility   Outcome Measure  Help needed turning from your back to your side while in a flat bed without using bedrails?: None Help needed moving from lying on your back to sitting on the side of a flat bed without using bedrails?: None Help needed moving to and from a bed to a chair (including a wheelchair)?: A Little Help needed standing up from a chair using your arms (e.g., wheelchair or bedside chair)?: A Little Help needed to walk in hospital room?: A Little Help needed climbing 3-5 steps with a railing? : Total 6 Click Score: 18    End of Session Equipment Utilized During Treatment: Gait belt;Oxygen Activity Tolerance: Patient limited by fatigue Patient left: with call bell/phone within reach;in chair;with chair alarm set Nurse Communication: Mobility status PT Visit Diagnosis: Unsteadiness on feet (R26.81);Muscle weakness (generalized) (M62.81);Difficulty in walking, not elsewhere classified (R26.2)     Time: 5449-2010 PT Time Calculation (min) (ACUTE ONLY): 21 min  Charges:  $Gait Training: 8-22 mins                     [place] Pager:  [phone]  Office:  [place] 06/21/2018, 9:15 AM

## 2018-06-21 NOTE — Progress Notes (Signed)
Dr. Rebeca Alert and residents are at bedside. Pt states he feels tired and weak all over, however his breathing feels back to normal.

## 2018-06-21 NOTE — NC FL2 (Signed)
[place] LEVEL OF CARE SCREENING TOOL     IDENTIFICATION  Patient Name: Brent [name] Birthdate: Aug 28, 1926 Sex: male Admission Date (Current Location): 06/18/2018  [facility] and [place] Number:  Herbalist and Address:  [facility], [place] [address], [place], [place] [address]      Provider Number: [id number]  Attending Physician Name and Address:  Oda Kilts, MD  Relative Name and Phone Number:  Angelita Ingles, spouse, [phone]    Current Level of Care: Hospital Recommended Level of Care: [place] Prior Approval Number:    Date Approved/Denied:   PASRR Number: [id number]  Discharge Plan: SNF    Current Diagnoses: Patient Active Problem List   Diagnosis Date Noted  . Ventilatory failure   . Seizure disorder ([place])   . Episode of recurrent major depressive disorder ([place])   . Severe protein-calorie malnutrition ([place])   . Acute on chronic respiratory failure with hypoxia and hypercapnia ([place]) 06/18/2018  . Acute respiratory failure with hypoxia and hypercarbia ([place]) 06/15/2018  . COPD exacerbation ([place]) 06/14/2018  . Other pancytopenia ([place]) 06/14/2018  . Seizures ([place]) 06/14/2018  . Protein-calorie malnutrition, severe 09/15/2017  . Acute respiratory failure with hypoxia ([place]) 09/14/2017  . Recurrent seizures ([place]) 07/06/2017  . Hypertension 07/06/2017  . Hyperlipidemia, unspecified 07/06/2017  . COPD (chronic obstructive pulmonary disease) ([place]) 07/06/2017  . Chronic dementia, without behavioral disturbance ([place]) 09/11/2014  . Failure to thrive in adult 03/09/2014    Orientation RESPIRATION BLADDER Height & Weight     Self, Time, Situation, Place  O2(Nasal cannula 2L;home cpap) Continent, External catheter Weight: 44.8 kg Height:  5\' 6"  (167.6 cm)  BEHAVIORAL SYMPTOMS/MOOD NEUROLOGICAL BOWEL NUTRITION STATUS    Convulsions/Seizures Continent Diet(Please see DC Summary)  AMBULATORY STATUS  COMMUNICATION OF NEEDS Skin   Limited Assist Verbally Normal                       Personal Care Assistance Level of Assistance  Bathing, Feeding, Dressing Bathing Assistance: Limited assistance Feeding assistance: Limited assistance Dressing Assistance: Limited assistance     Functional Limitations Info  Sight, Hearing, Speech Sight Info: Adequate Hearing Info: Adequate Speech Info: Adequate    SPECIAL CARE FACTORS FREQUENCY  PT (By licensed PT), OT (By licensed OT)     PT Frequency: 5x/week OT Frequency: 3x/week            Contractures Contractures Info: Not present    Additional Factors Info  Code Status, Allergies Code Status Info: DNR Allergies Info: NKA           Current Medications (06/21/2018):  This is the current hospital active medication list Current Facility-Administered Medications  Medication Dose Route Frequency Provider Last Rate Last Dose  . albuterol (PROVENTIL) (2.5 MG/3ML) 0.083% nebulizer solution 2.5 mg  2.5 mg Nebulization Q4H PRN Oval Linsey, MD      . arformoterol [facility]) nebulizer solution 15 mcg  15 mcg Nebulization BID Isabelle Course, MD      . aspirin EC tablet 81 mg  81 mg Oral Daily Neva Seat, MD   81 mg at 06/21/18 2542  . budesonide (PULMICORT) nebulizer solution 0.25 mg  0.25 mg Nebulization BID Isabelle Course, MD      . chlorhexidine (PERIDEX) 0.12 % solution 15 mL  15 mL Mouth Rinse BID Oval Linsey, MD   15 mL at 06/21/18 7062  . cholecalciferol (VITAMIN D3) tablet 1,000 Units  1,000 Units  Oral Q1200 Neva Seat, MD   1,000 Units at 06/21/18 1434  . enoxaparin (LOVENOX) injection 30 mg  30 mg Subcutaneous Q24H Oval Linsey, MD   30 mg at 06/21/18 8527  . feeding supplement (ENSURE ENLIVE) (ENSURE ENLIVE) liquid 237 mL  237 mL Oral TID BM Neva Seat, MD   237 mL at 06/21/18 [phone]  . guaiFENesin (MUCINEX) 12 hr tablet 600 mg  600 mg Oral BID PRN Neva Seat, MD      . levETIRAcetam  (KEPPRA) tablet 1,000 mg  1,000 mg Oral BID Neva Seat, MD   1,000 mg at 06/21/18 2353  . MEDLINE mouth rinse  15 mL Mouth Rinse q12n4p Oval Linsey, MD   15 mL at 06/21/18 1435  . mirtazapine (REMERON) tablet 7.5 mg  7.5 mg Oral QHS Neva Seat, MD   7.5 mg at 06/20/18 2320  . multivitamin with minerals tablet 1 tablet  1 tablet Oral Daily Oda Kilts, MD   1 tablet at 06/21/18 [phone]  . pantoprazole (PROTONIX) EC tablet 40 mg  40 mg Oral Daily Neva Seat, MD   40 mg at 06/21/18 3154  . phenytoin (DILANTIN) ER capsule 100 mg  100 mg Oral BID Neva Seat, MD   100 mg at 06/21/18 0086  . sodium chloride flush (NS) 0.9 % injection 3 mL  3 mL Intravenous Q12H Neva Seat, MD   3 mL at 06/21/18 0939  . sodium chloride flush (NS) 0.9 % injection 3 mL  3 mL Intravenous Q12H Neva Seat, MD   3 mL at 06/21/18 [phone]  . sodium chloride flush (NS) 0.9 % injection 3 mL  3 mL Intravenous PRN Neva Seat, MD         Discharge Medications: Please see discharge summary for a list of discharge medications.  Relevant Imaging Results:  Relevant Lab Results:   Additional Information SSN: [place], [place]

## 2018-06-21 NOTE — Progress Notes (Addendum)
Subjective: Breathing is back to baseline but he feels generally weak and complains of right heel pain. After placing a pillow under his right foot, his pain improved. For his weakness, he says that he usually gets around well at home but the weakness started all of the sudden. No focal neurologic symptoms. No chest pain or sob. Discussed that his malnutrition is definitely playing a big role in his weakness but given the sudden onset, we will check more labs and look for another cause. Discussed option of going to SNF and patient is hesitant, states he will think about it and speak with his family. He would really love to get well and go home from the hospital and avoid SNF.   Addendum 1:52pm: Spoke with wife and daughter. They are agreeable to SNF placement for short term rehab. Appreciate social work and case management's assistance with dispo. They are hopeful for SNF placement tomorrow.   Objective:  Vital signs in last 24 hours: Vitals:   06/20/18 2142 06/21/18 0514 06/21/18 0929 06/21/18 1000  BP: (!) 129/55 126/61    Pulse: 100 (!) 108  (!) 128  Resp: 16 15    Temp: 97.7 F (36.5 C) 97.7 F (36.5 C)    TempSrc: Oral Oral    SpO2: 97% 98% 99% 98%  Weight:      Height:       Gen: frail, cachetic gentleman. Laying in bed. Pulm: Breathing comfortably on 2L [place], CTAB.  Neuro: no focal defecits, strength is intact in all extremities MSK: right heel is TTP without redness, swelling, or warmth. Skin is intact.   Assessment/Plan:  Principal Problem:   Acute on chronic respiratory failure with hypoxia and hypercapnia (HCC) Active Problems:   Failure to thrive in adult   COPD (chronic obstructive pulmonary disease) (HCC)   Ventilatory failure   Seizure disorder ([place])   Episode of recurrent major depressive disorder (HCC)   Severe protein-calorie malnutrition ([place])   46yoM with COPD on home O2, seizure disorder, dementia, and depression presenting with acute on chronic hypoxic,  hypercapnic respiratory failure in the setting of one day of dry cough, low grade fever, and weakness.   Acute on Chronic hypoxic, hypercarbic respiratory failure: Breathing is back to baseline. He slept well on his home oxygen. Mental status remains intact and at baseline. Respiratory therapy expressed concern that his inspiratory drive is not adequate to get inhaled medication into his lungs. Discussed alternatives and RT recommends nebulized treatments.  - discontinued inhalers  - nebulized LABA, ICS - nebulized albuterol prn sob  - mucinex prn  - supplemental O2  Weakness: Endorses sudden onset weakness prior to hospitalization. Now with new tachycardia in the low 100s, other vital signs are stable. He has not been receiving prn albuterol. Denies any focal symptoms. Neuro exam is reassuring. Suspect this is mostly due to malnutrition and failure to thrive in this elderly, cachetic man with advanced copd but we will check CMP, ESR, CRP, Troponin to make sure we aren't missing anything. PT/OT recommending 24hr supervision at home but wife cannot provide this. Family and patient are declining SNF, will continue to discuss dispo.  - CMP, CK, and troponin all WNLs - ESR is elevated at 50 but when adjusted for age, upper limit of normal would be 44 - CRP elevated but is very nonspecific by itself. Reassuring that CK and ESR are normal for age  Seizure Disorder: Well controlled. Continue home keppra and dilantin   Dispo: Anticipated discharge  1-2 days pending SNF placement. He is medically cleared for discharge.   Isabelle Course, MD 06/21/2018, 12:42 PM Pager: [phone]

## 2018-06-21 NOTE — Progress Notes (Signed)
Patient self places home CPAP only required assistance with water, no distress noted at this time.

## 2018-06-22 DIAGNOSIS — K5900 Constipation, unspecified: Secondary | ICD-10-CM

## 2018-06-22 DIAGNOSIS — Z515 Encounter for palliative care: Secondary | ICD-10-CM

## 2018-06-22 DIAGNOSIS — Z66 Do not resuscitate: Secondary | ICD-10-CM

## 2018-06-22 DIAGNOSIS — R0902 Hypoxemia: Secondary | ICD-10-CM

## 2018-06-22 DIAGNOSIS — Z7189 Other specified counseling: Secondary | ICD-10-CM

## 2018-06-22 DIAGNOSIS — R627 Adult failure to thrive: Secondary | ICD-10-CM

## 2018-06-22 MED ORDER — ADULT MULTIVITAMIN W/MINERALS CH: 1.0000 | ORAL_TABLET | Freq: Every day | ORAL | 0 refills | Status: AC

## 2018-06-22 MED ORDER — SENNA 8.6 MG PO TABS
1.0000 | ORAL_TABLET | Freq: Every day | ORAL | Status: DC
  Administered 2018-06-22: 8.6 mg via ORAL
  Filled 2018-06-22: qty 1

## 2018-06-22 MED ORDER — POLYETHYLENE GLYCOL 3350 17 G PO PACK: 17.0000 g | PACK | Freq: Every day | ORAL | 0 refills | Status: AC | PRN

## 2018-06-22 MED ORDER — POLYETHYLENE GLYCOL 3350 17 G PO PACK
17.0000 g | PACK | Freq: Every day | ORAL | Status: DC
  Administered 2018-06-22: 17 g via ORAL
  Filled 2018-06-22: qty 1

## 2018-06-22 NOTE — Progress Notes (Signed)
Occupational Therapy Treatment Patient Details Name: Brent [name] MRN: [id number] DOB: [birth year] Today's Date: 06/22/2018    History of present illness 45yoM with COPD on home O2, seizure disorder, dementia, and depression presenting with acute on chronic hypoxic, hypercapnic respiratory failure in the setting of one day of dry cough, low grade fever, and weakness.    OT comments  Pt making progress with functional goals. OT will continue to follow  Follow Up Recommendations  Home health OT;Supervision/Assistance - 24 hour    Equipment Recommendations  None recommended by OT    Recommendations for Other Services      Precautions / Restrictions Precautions Precautions: Fall Restrictions Weight Bearing Restrictions: No       Mobility Bed Mobility Overal bed mobility: Needs Assistance Bed Mobility: Rolling;Sidelying to Sit Rolling: Supervision Sidelying to sit: Supervision       General bed mobility comments: Pt transitioned to EOB without physical assist but did so slowly.   Transfers Overall transfer level: Needs assistance   Transfers: Sit to/from Stand Sit to Stand: Min guard              Balance Overall balance assessment: Needs assistance Sitting-balance support: Feet supported;No upper extremity supported Sitting balance-Leahy Scale: Fair     Standing balance support: During functional activity;Bilateral upper extremity supported Standing balance-Leahy Scale: Poor                             ADL either performed or assessed with clinical judgement   ADL Overall ADL's : Needs assistance/impaired     Grooming: Wash/dry face;Wash/dry hands;Min guard;Standing       Lower Body Bathing: Cueing for safety;Sit to/from stand;Minimal assistance Lower Body Bathing Details (indicate cue type and reason): simulated Upper Body Dressing : Sitting;Min guard       Toilet Transfer: Stand-pivot;Min guard;RW;BSC;Cueing for safety   Toileting-  Clothing Manipulation and Hygiene: Minimal assistance;Sit to/from stand       Functional mobility during ADLs: Min guard;Cueing for safety       Vision Patient Visual Report: No change from baseline     Perception     Praxis      Cognition Arousal/Alertness: Awake/alert Behavior During Therapy: WFL for tasks assessed/performed Overall Cognitive Status: History of cognitive impairments - at baseline                                          Exercises     Shoulder Instructions       General Comments      Pertinent Vitals/ Pain       Pain Assessment: Faces Faces Pain Scale: Hurts little more Pain Location: generalized Pain Descriptors / Indicators: Grimacing;Guarding Pain Intervention(s): Limited activity within patient's tolerance;Monitored during session;Repositioned  Home Living                                          Prior Functioning/Environment              Frequency  Min 2X/week        Progress Toward Goals  OT Goals(current goals can now be found in the care plan section)  Progress towards OT goals: Progressing toward goals     Plan Discharge plan remains appropriate  Co-evaluation                 AM-PAC OT "6 Clicks" Daily Activity     Outcome Measure   Help from another person eating meals?: A Little Help from another person taking care of personal grooming?: A Little Help from another person toileting, which includes using toliet, bedpan, or urinal?: A Lot Help from another person bathing (including washing, rinsing, drying)?: A Lot Help from another person to put on and taking off regular upper body clothing?: A Little Help from another person to put on and taking off regular lower body clothing?: A Lot 6 Click Score: 15    End of Session Equipment Utilized During Treatment: Gait belt;Other (comment);Rolling walker(BSC)  OT Visit Diagnosis: Unsteadiness on feet (R26.81);Muscle weakness  (generalized) (M62.81)   Activity Tolerance Patient limited by fatigue   Patient Left with call bell/phone within reach;in chair;with chair alarm set   Nurse Communication          Time: [phone] OT Time Calculation (min): 24 min  Charges: OT General Charges $OT Visit: 1 Visit OT Treatments $Self Care/Home Management : 8-22 mins $Therapeutic Activity: 8-22 mins     Britt Bottom 06/22/2018, 11:04 AM

## 2018-06-22 NOTE — Progress Notes (Signed)
Pt refuses to allow me to help feed him.

## 2018-06-22 NOTE — Consult Note (Signed)
Consultation Note Date: 06/22/2018   Patient Name: Brent [name]  DOB: 1926/09/22  MRN: [id number]  Age / Sex: 85 y.o., male  PCP: Chase Picket, MD Referring Physician: Oda Kilts, MD  Reason for Consultation: Establishing goals of care  HPI/Patient Profile: 85 y.o. male admitted on 06/18/2018 from home with complaints of generalized weakness and shortness of breath. He has a past medical history of depression, seizures, COPD (home oxygen 2L), and chronic respiratory failure. He was recently discharged from hospital two days prior to admission where he was treated for COPD exacerbation with BiPAP use, prednisone, and azithromycin. He was discharged to home with family. Family reports day of admission patient was weak and unable to get out of bed. EMS was called and on assessment oxygen saturations were found to be 50% on room air. Patient was not on oxygen when they arrived although he had orders to be on oxygen. He was placed on NRB with saturations increasing into the 90s. During his ED course he required use of BiPAP. Chest x-ray showed emphysema without evidence of acute cardiopulmonary disease. Since admission patient continues to have weakness. He continues on home medications with additional respiratory support via nebulizer. Palliative Medicine team consulted for goals of care.    Clinical Assessment and Goals of Care: I have reviewed medical records including lab results, imaging, Epic notes, and MAR, received report from the bedside RN, and assessed the patient. I spoke with daughter, Golden Hurter and her mother Angelita Ingles) to discuss diagnosis prognosis, [place], EOL wishes, disposition and options. Patient sleeping but easily aroused. He is A&O however, when asking questions he defers to ask his daughter.   I introduced Palliative Medicine as specialized medical care for people living with  serious illness. It focuses on providing relief from the symptoms and stress of a serious illness. The goal is to improve quality of life for both the patient and the family.  Family reports patient and family recently moved to the [place] area over a year ago.  Prior to that they were residing in the [place] area.   Patient generally has a good appetite and is ambulatory with a cane. He does require some assistance with ADLs at time due to fatigue. Wife reports patient has lost about 50lbs over the past year due to changes in appetite and sickness. Family reports he has experienced more complications with respiratory over the past year as previously they were more concerned with seizure management. Patient has taken more frequent naps over the past 2-3 months and not seemed as interested in the things he once enjoyed.    We discussed his current illness and what it means in the larger context of his on-going co-morbidities.  Natural disease trajectory and expectations at EOL were discussed. Family verbalized understanding of patient's condition.   I attempted to elicit values and goals of care important to the patient.    The difference between aggressive medical intervention and comfort care was considered in light of the patient's goals of care. Family remains  hopeful for some improvement in patient's condition however they feel if patient continues to show no signs of improvement, continues to decline with poor appetite they will become more open to transitioning goals of care from medical interventions to more of a comfort approach.   Family and patient also confirms DNR/DNI specifying wishes for no heroic measures in the event of a cardiac or respiratory event.   Hospice and Palliative Care services outpatient were explained and offered. Given specified goals are to continue with attempts for rehabilitation and medical intervention, community palliative support is recommended.  Patient  and family verbalized their understanding and awareness of palliative's goals and philosophy of care. They are requesting support once patient is discharged and aware in the event patient declines or they feel goals should be re-evaluated they may discuss with their outpatient team.  Questions and concerns were addressed.  The family was encouraged to call with questions or concerns.  PMT will continue to support holistically.  Primary Decision PATIENT/DAUGHTER: JAMELLEAH MUHAMMAD and WIFE: ROBERTA Baranek    SUMMARY OF RECOMMENDATIONS    DNR-as confirmed by patient/daughter  Continue to treat  Family is hopeful for some improvement however, if patient continues to show poor progress or further decline, family is open to transitioning care to a more comfort approach. Goals is for SNF w/rehab and palliative support and guidance.   Outpatient Palliative for support  CSW referral for Palliative at SNF.   Code Status/Advance Care Planning:  DNR   Symptom Management:   Per attending   Palliative Prophylaxis:   Aspiration, Bowel Regimen, Delirium Protocol and Oral Care  Additional Recommendations (Limitations, Scope, Preferences):  Full Scope Treatment  Psycho-social/Spiritual:   Desire for further Chaplaincy support:NO  Additional Recommendations: Caregiving  Support/Resources  Prognosis:   Unable to determine-Guarded in the setting of acute on chronic respiratory failure with hypoxia and hypercapnia, MDD, severe protein calorie malnutrition, cachetic, deconditioned, COPD, asthma, seizures, generalized weakness, and constipation.   Discharge Planning: [facility] for rehab with Palliative care service follow-up      Primary Diagnoses: Present on Admission: . Acute on chronic respiratory failure with hypoxia and hypercapnia (HCC) . Ventilatory failure . Episode of recurrent major depressive disorder ([place]) . Severe protein-calorie malnutrition ([place]) .  COPD (chronic obstructive pulmonary disease) ([place]) . Failure to thrive in adult   I have reviewed the medical record, interviewed the patient and family, and examined the patient. The following aspects are pertinent.  Past Medical History:  Diagnosis Date  . Asthma   . Cancer ([place])    cancer  . Emphysema of lung ([place])   . Seizures ([place])    Social History   Socioeconomic History  . Marital status: Unknown    Spouse name: Not on file  . Number of children: Not on file  . Years of education: Not on file  . Highest education level: Not on file  Occupational History  . Not on file  Social Needs  . Financial resource strain: Not on file  . Food insecurity:    Worry: Not on file    Inability: Not on file  . Transportation needs:    Medical: Not on file    Non-medical: Not on file  Tobacco Use  . Smoking status: Former Research scientist (life sciences)  . Smokeless tobacco: Never Used  Substance and Sexual Activity  . Alcohol use: Not Currently  . Drug use: Never  . Sexual activity: Not Currently  Lifestyle  . Physical activity:    Days  per week: Not on file    Minutes per session: Not on file  . Stress: Not on file  Relationships  . Social connections:    Talks on phone: Not on file    Gets together: Not on file    Attends religious service: Not on file    Active member of club or organization: Not on file    Attends meetings of clubs or organizations: Not on file    Relationship status: Not on file  Other Topics Concern  . 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 No pertinent family history. Scheduled Meds: . arformoterol  15 mcg Nebulization BID  . aspirin EC  81 mg Oral Daily  . budesonide (PULMICORT) nebulizer solution  0.25 mg Nebulization BID  . chlorhexidine  15 mL Mouth Rinse BID  .  cholecalciferol  1,000 Units Oral Q1200  . enoxaparin (LOVENOX) injection  30 mg Subcutaneous Q24H  . feeding supplement (ENSURE ENLIVE)  237 mL Oral TID BM  . levETIRAcetam  1,000 mg Oral BID  . mouth rinse  15 mL Mouth Rinse q12n4p  . mirtazapine  7.5 mg Oral QHS  . multivitamin with minerals  1 tablet Oral Daily  . pantoprazole  40 mg Oral Daily  . phenytoin  100 mg Oral BID  . polyethylene glycol  17 g Oral Daily  . senna  1 tablet Oral Daily  . sodium chloride flush  3 mL Intravenous Q12H  . sodium chloride flush  3 mL Intravenous Q12H   Continuous Infusions: PRN Meds:.albuterol, guaiFENesin, sodium chloride flush Medications Prior to Admission:  Prior to Admission medications   Medication Sig Start Date End Date Taking? Authorizing Provider  acetaminophen (TYLENOL) 500 MG tablet Take 1,000 mg by mouth every 6 (six) hours as needed for mild pain.   Yes [provider]  albuterol (PROVENTIL) (2.5 MG/3ML) 0.083% nebulizer solution Take 3 mLs (2.5 mg total) by nebulization every 2 (two) hours as needed for wheezing or shortness of breath. 09/17/17  Yes Emokpae, Courage, MD  albuterol (VENTOLIN HFA) 108 (90 Base) MCG/ACT inhaler Inhale 2 puffs into the lungs every 4 (four) hours as needed for wheezing or shortness of breath. 09/17/17  Yes Emokpae, Courage, MD  aspirin EC 81 MG tablet Take 1 tablet (81 mg total) by mouth daily. With Breakfast 09/17/17  Yes Emokpae, Courage, MD  Cholecalciferol (VITAMIN D3) 1000 units CAPS Take 1,000 Units by mouth daily at 12 noon.    Yes [provider]  feeding supplement, ENSURE ENLIVE, (ENSURE ENLIVE) LIQD Take 237 mLs by mouth 3 (three) times daily between meals. 09/17/17  Yes Emokpae, Courage, MD  furosemide (LASIX) 20 MG tablet Take 20 mg by mouth daily as needed for edema.   Yes [provider]  guaiFENesin (MUCINEX) 600 MG 12 hr tablet Take 1 tablet (600 mg total) by mouth 2 (two) times daily. Patient taking differently:  Take 600 mg by mouth 2 (two) times daily as needed for cough or to loosen phlegm.  09/17/17  Yes Emokpae, Courage, MD  levETIRAcetam (KEPPRA) 1000 MG tablet Take 1 tablet (1,000 mg total) by mouth 2 (two) times daily. 09/17/17 06/18/18 Yes Emokpae, Courage, MD  mirtazapine (REMERON) 7.5 MG tablet Take 1 tablet (7.5 mg  total) by mouth at bedtime. For mood and appetite Patient taking differently: Take 7.5 mg by mouth every other day. For mood and appetite 09/17/17  Yes Emokpae, Courage, MD  omeprazole (PRILOSEC) 20 MG capsule Take 1 capsule (20 mg total) by mouth daily. 09/17/17 09/17/18 Yes Emokpae, Courage, MD  phenytoin (DILANTIN) 100 MG ER capsule Take 1 capsule (100 mg total) by mouth 2 (two) times daily. 09/17/17  Yes Emokpae, Courage, MD  arformoterol (BROVANA) 15 MCG/2ML NEBU Take 2 mLs (15 mcg total) by nebulization daily. 06/20/18   Isabelle Course, MD  budesonide (PULMICORT) 0.5 MG/2ML nebulizer solution Take 2 mLs (0.5 mg total) by nebulization daily. 06/20/18   Isabelle Course, MD  ipratropium-albuterol (DUONEB) 0.5-2.5 (3) MG/3ML SOLN Take 3 mLs by nebulization every 4 (four) hours as needed. 06/20/18   Isabelle Course, MD  Multiple Vitamin (MULTIVITAMIN WITH MINERALS) TABS tablet Take 1 tablet by mouth daily. 06/23/18   Isabelle Course, MD  polyethylene glycol [facility] / Floria Raveling) packet Take 17 g by mouth daily as needed for mild constipation. 06/22/18   Isabelle Course, MD  tiotropium (SPIRIVA HANDIHALER) 18 MCG inhalation capsule Place 1 capsule (18 mcg total) into inhaler and inhale daily. Patient not taking: Reported on 06/14/2018 09/17/17 09/17/18  Roxan Hockey, MD   No Known Allergies Review of Systems  Neurological: Positive for weakness.  All other systems reviewed and are negative.   Physical Exam Vitals signs and nursing note reviewed.  Constitutional:      General: He is awake.     Appearance: He is cachectic. He is ill-appearing.     Comments: Thin, chronically ill appearing     Cardiovascular:     Rate and Rhythm: Regular rhythm. Tachycardia present.     Pulses: Normal pulses.     Heart sounds: Normal heart sounds.  Pulmonary:     Effort: Pulmonary effort is normal.     Breath sounds: Decreased breath sounds present.  Abdominal:     General: Abdomen is flat. Bowel sounds are normal.     Palpations: Abdomen is soft.  Musculoskeletal:     Comments: Generalized weakness   Skin:    General: Skin is warm and dry.  Neurological:     Mental Status: He is alert and oriented to person, place, and time.     Motor: Weakness and atrophy present.  Psychiatric:        Behavior: Behavior is cooperative.        Cognition and Memory: Cognition is impaired.     Vital Signs: BP 127/65 (BP Location: Right Arm)   Pulse (!) 120   Temp 98.1 F (36.7 C) (Oral)   Resp 14   Ht 5\' 6"  (1.676 m)   Wt 44.8 kg   SpO2 100%   BMI 15.94 kg/m  Pain Scale: 0-10   Pain Score: 0-No pain   SpO2: SpO2: 100 % O2 Device:SpO2: 100 % O2 Flow Rate: .O2 Flow Rate (L/min): 2 L/min  IO: Intake/output summary:   Intake/Output Summary (Last 24 hours) at 06/22/2018 1301 Last data filed at 06/22/2018 1000 Gross per 24 hour  Intake 463 ml  Output 1290 ml  Net -827 ml    LBM: Last BM Date: 06/15/18 Baseline Weight: Weight: 44.8 kg Most recent weight: Weight: 44.8 kg     Palliative Assessment/Data: PPS 40%   Flowsheet Rows     Most Recent Value  Intake Tab  Unit at Time of Referral  Intermediate Care Unit  Date Notified  06/21/18  Palliative Care Type  New Palliative care  Reason for referral  Clarify Goals of Care  Date of Admission  06/18/18  # of days IP prior to Palliative referral  3  Clinical Assessment  Psychosocial & Spiritual Assessment  Palliative Care Outcomes      Time In:1205 Time Out: 1310 Time Total: 65 min  Greater than 50%  of this time was spent counseling and coordinating care related to the above assessment and plan.  Signed by:  Alda Lea, AGPCNP-BC Palliative Medicine Team  Phone: [phone] Fax: [phone] Pager: [phone] Amion: Bjorn Pippin      Please contact Palliative Medicine Team phone at [phone] for questions and concerns.  For individual provider: See Shea Evans

## 2018-06-22 NOTE — Progress Notes (Addendum)
  Date: 06/21/2018  Patient name: [name]  Medical record number: [id number]  Date of birth: [birth year]   I have seen and evaluated this patient and I have discussed the plan of care with the house staff. Please see their note for complete details. I concur with their findings with the following additions/corrections:   Breathing seems back to normal, but he continues to feel extremely weak.  On exam, his voice is hypophonic and he has 4/5 strength in his arms and legs except for 5/5 strength in his hands.  We have sent a number of test to evaluate for underlying muscular or neurologic issues, but upon speaking further with his family in the afternoon, it appears that his weakness has been gradually progressive over many months and is more likely related to his chronic illness and malnutrition.  We will follow-up on all the test results, but I suspect he will not require any additional testing or evaluation.  At this point, we are planning on moving forward with discharge to rehab to try to regain his strength and function before returning home.  We have also asked the palliative care team to see him and meet his family and discuss options for palliative care services at home.  He may be appropriate for hospice services in the near future as well.  Please note this attestation is signed late for services I personally performed on 06/21/2018.  Lenice Pressman, M.D., Ph.D.

## 2018-06-22 NOTE — Progress Notes (Signed)
   Subjective: No new complaints. Is ready to get out of the hospital and go to rehab. He has not had a bowel movement in several days. Will increase laxatives today.   Objective:  Vital signs in last 24 hours: Vitals:   06/22/18 0538 06/22/18 0810 06/22/18 0811 06/22/18 1000  BP: 127/65     Pulse: (!) 109   (!) 120  Resp: 18   14  Temp: 98.1 F (36.7 C)     TempSrc: Oral     SpO2: 95% 100% 100% 100%  Weight:      Height:       Gen: frail, cachetic gentleman. Sitting up in chair. Pulm: Breathing comfortably on 2L [place], CTAB.  Cardiac: RRR, no m/r/g Abd: distended, firm, nontender   Assessment/Plan:  Principal Problem:   Acute on chronic respiratory failure with hypoxia and hypercapnia (HCC) Active Problems:   Failure to thrive in adult   COPD (chronic obstructive pulmonary disease) (HCC)   Ventilatory failure   Seizure disorder ([place])   Episode of recurrent major depressive disorder (HCC)   Severe protein-calorie malnutrition ([place])   57yoM with COPD on home O2, seizure disorder, dementia, and depression presenting with acute on chronic hypoxic, hypercapnic respiratory failure in the setting of one day of dry cough, low grade fever, and weakness.   Weakness: Further work up did not reveal any other underlying cause. On further review of his chart and after speaking with family, it sounds more chronic than acute. Most likely due to malnutrition and advanced copd - discharge to SNF for rehab - palliative care consult, they are hoping to speak with family today  - continue mirtazapine   Constipation: last documented bowel movement was 7 days ago. Abdomen is firm and distended on exam. No pain or tenderness to palpation. Tolerating po intake without nausea or vomiting.  - scheduled miralax and senna this morning - If no BM prior to discharge, can give magnesium citrate to get his bowels moving prior to discharge.    Acute on Chronic hypoxic, hypercarbic respiratory failure:  Stable.  - discontinued inhalers due to inadequate inhalation  - nebulized LABA, ICS - nebulized albuterol prn sob  - supplemental O2  Seizure Disorder: Well controlled. Continue home keppra and dilantin   Dispo: Anticipated discharge pending SNF placement. He is medically cleared for discharge.   Isabelle Course, MD 06/22/2018, 10:55 AM Pager: [phone]

## 2018-06-22 NOTE — Progress Notes (Signed)
Patient will DC to: [facility] Anticipated DC date: 06/22/18 Family notified: Spouse and daughter Transport by: Corey Harold    Per MD patient ready for DC to [facility]. RN, patient, patient's family, and facility notified of DC. Discharge Summary and FL2 sent to facility. RN to call report prior to discharge [phone] or [phone]). DC packet on chart. Ambulance transport requested for patient.   CSW will sign off for now as social work intervention is no longer needed. Please consult [place] again if new needs arise.  Cedric Fishman, LCSW Clinical Social Worker [phone]

## 2018-06-22 NOTE — Progress Notes (Signed)
[facility] checking patient's insurance to see if they need pre-authorization before patient discharges.   Percell Locus Pasqual Farias LCSW [phone]

## 2018-06-22 NOTE — Progress Notes (Signed)
NCM spoke with wife and daughter via phone regarding SNF placement .SNF bed offers shared by NCM. Wife and daughter selected [facility]. CSW to arrange placement with [facility]. Pt will need non - emergency ambulance for transportation to SNF.  Whitman Hero RN,BSN,CM

## 2018-06-22 NOTE — Clinical Social Work Placement (Signed)
   CLINICAL SOCIAL WORK PLACEMENT  NOTE  Date:  06/22/2018  Patient Details  Name: Brent [name] MRN: [id number] Date of Birth: [birth year]  Clinical Social Work is seeking post-discharge placement for this patient at the [place] level of care (*CSW will initial, date and re-position this form in  chart as items are completed):  Yes   Patient/family provided with [place] Work Department's list of facilities offering this level of care within the geographic area requested by the patient (or if unable, by the patient's family).  Yes   Patient/family informed of their freedom to choose among providers that offer the needed level of care, that participate in Medicare, Medicaid or managed care program needed by the patient, have an available bed and are willing to accept the patient.  Yes   Patient/family informed of [facility]'s ownership interest in [facility], as well as of the fact that they are under no obligation to receive care at these facilities.  PASRR submitted to [employer] on 06/21/18     PASRR number received on 06/21/18     Existing PASRR number confirmed on       FL2 transmitted to all facilities in geographic area requested by pt/family on 06/21/18     FL2 transmitted to all facilities within larger geographic area on       Patient informed that his/her managed care company has contracts with or will negotiate with certain facilities, including the following:        Yes   Patient/family informed of bed offers received.  Patient chooses bed at [facility] and patient chooses bed at      Patient to be transferred to [facility].  Patient to be transferred to facility by [facility]     Patient family notified on 06/22/18 of transfer.  Name of family member notified:  Spouse and daughter     PHYSICIAN       Additional Comment:     _______________________________________________ Benard Halsted, LCSW 06/22/2018, 12:54 PM

## 2018-06-23 ENCOUNTER — Non-Acute Institutional Stay (SKILLED_NURSING_FACILITY): Payer: MEDICARE | Admitting: Internal Medicine

## 2018-06-23 ENCOUNTER — Encounter: Payer: Self-pay | Admitting: Internal Medicine

## 2018-06-23 DIAGNOSIS — E43 Unspecified severe protein-calorie malnutrition: Secondary | ICD-10-CM | POA: Diagnosis not present

## 2018-06-23 DIAGNOSIS — J9622 Acute and chronic respiratory failure with hypercapnia: Secondary | ICD-10-CM

## 2018-06-23 DIAGNOSIS — R627 Adult failure to thrive: Secondary | ICD-10-CM | POA: Diagnosis not present

## 2018-06-23 DIAGNOSIS — Z8546 Personal history of malignant neoplasm of prostate: Secondary | ICD-10-CM | POA: Diagnosis not present

## 2018-06-23 DIAGNOSIS — J9621 Acute and chronic respiratory failure with hypoxia: Secondary | ICD-10-CM | POA: Diagnosis not present

## 2018-06-23 LAB — CULTURE, BLOOD (ROUTINE X 2)
Culture: NO GROWTH
Culture: NO GROWTH

## 2018-06-23 NOTE — Assessment & Plan Note (Addendum)
Nutrition consult at SNF; Med Pass supplements

## 2018-06-23 NOTE — Progress Notes (Signed)
NURSING HOME LOCATION:  [facility] ROOM NUMBER: 107/A   CODE STATUS: Full Code   PCP: Verlin Fester, MD  This is a comprehensive admission note to [facility] performed on this date less than 30 days from date of admission. Included are preadmission medical/surgical history; reconciled medication list; family history; social history and comprehensive review of systems.  Corrections and additions to the records were documented. Comprehensive physical exam was also performed. Additionally a clinical summary was entered for each active diagnosis pertinent to this admission in the Problem List to enhance continuity of care.  HPI: Patient was hospitalized 2/22-2/26/2020, admitted for COPD exacerbation.  With aggressive pulmonary toilet respiratory function improved to his baseline, but weakness remained a persistent issue.  According to the family he had had progressive worsening weakness over the last few months associated with significant weight loss and evidence of severe malnutrition.  This was possibly cachexia from end-stage COPD.  Palliative care worked with the patient and family to facilitate eventual hospice care as clinically appropriate.  Initially he was discharged to the SNF was for rehab, although the extent of recovery and function may be limited. He was discharged on Brovana , DuoNeb and Pulmicort.  Steroids were discontinued at discharge.  Remeron was prescribed as an appetite stimulant.  Past medical and surgical history: Includes seizure disorder, history of prostate cancer and asthma as well as advanced emphysema. He has apparently had a herniorrhaphy.  Social history: Drinker, former smoker  Family history: Patient cannot provide any definite history.   Review of systems: He states that his dyspnea is variable.  He has an occasional nonproductive cough.  He does state that he has difficulty swallowing at times.  Rarely he has had blood in his stool.  His right  heel is sore.  He is intolerant to cold.  Constitutional: No fever, significant weight change, fatigue  Eyes: No redness, discharge, pain, vision change ENT/mouth: No nasal congestion, purulent discharge, earache, change in hearing, sore throat  Cardiovascular: No chest pain, palpitations, paroxysmal nocturnal dyspnea, claudication, edema  Respiratory: No  sputum production, hemoptysis, significant snoring, apnea  Gastrointestinal: No heartburn,  abdominal pain, nausea /vomiting, rectal bleeding, melena, change in bowels Genitourinary: No dysuria, hematuria, pyuria, incontinence, nocturia Musculoskeletal: No joint stiffness, joint swelling Dermatologic: No rash, pruritus, change in appearance of skin Neurologic: No dizziness, headache, syncope, seizures, numbness, tingling Psychiatric: No significant anxiety, depression, insomnia, anorexia Endocrine: No change in hair/skin/nails, excessive thirst, excessive hunger, excessive urination  Hematologic/lymphatic: No significant bruising, lymphadenopathy, abnormal bleeding Allergy/immunology: No itchy/watery eyes, significant sneezing, urticaria, angioedema  Physical exam:  Pertinent or positive findings: He is grossly cachectic with diffuse muscle wasting, most obvious of the thorax.  He appears chronically ill.  Hair is disheveled.  Pattern alopecia is present.  He has a full beard and mustache.  Arcus senilis is noted.  He does wear nasal oxygen.  He is dyspneic at rest.  When I came in he was taking medications and he was observed to swallow the pills very slowly.  Dentition is poor with isolated few remaining mandibular teeth.  The maxilla is edentulous.  His voice is weak and cracks.  He has a cleft palate.  Slight gallop cadence was suggested. Repeat pulse check was 92. Breath sounds are decreased with minor scattered rales.  Pedal pulses are decreased.  He has clubbing of the nailbeds.  He is markedly weak to opposition in all extremities.  He  has splotchy vitiligo over the right lower lateral  extremity.  Slight exfoliation over the soles but no lesion is noted of the right heel.  He did state that this was tender to palpation.  General appearance: no acute distress, increased work of breathing is present.   Lymphatic: No lymphadenopathy about the head, neck, axilla. Eyes: No conjunctival inflammation or lid edema is present. There is no scleral icterus. Ears:  External ear exam shows no significant lesions or deformities.   Nose:  External nasal examination shows no deformity or inflammation. Nasal mucosa are pink and moist without lesions, exudates Neck:  No thyromegaly, masses, tenderness noted.    Heart:  No murmur, click, rub.  Lungs:  without wheezes, rhonchi,  rubs. Abdomen: Bowel sounds are normal.  Abdomen is soft and nontender with no organomegaly, hernias, masses. GU: Deferred  Extremities:  No cyanosis, edema. Neurologic exam:Balance, Rhomberg, finger to nose testing could not be completed due to clinical state Skin: Warm & dry w/o tenting. No significant rash.  See clinical summary under each active problem in the Problem List with associated updated therapeutic plan

## 2018-06-23 NOTE — Assessment & Plan Note (Signed)
Continue pulmonary toilet and nasal oxygen at the SNF

## 2018-06-23 NOTE — Assessment & Plan Note (Signed)
Nutrition consult at [facility]

## 2018-06-23 NOTE — Assessment & Plan Note (Signed)
No PSA in Epic Clarify prostate cancer status with PCP because of the adult failure to thrive

## 2018-06-23 NOTE — Patient Instructions (Signed)
See assessment and plan under each diagnosis in the problem list and acutely for this visit 

## 2018-07-05 ENCOUNTER — Encounter: Payer: Self-pay | Admitting: Internal Medicine

## 2018-07-05 ENCOUNTER — Non-Acute Institutional Stay (SKILLED_NURSING_FACILITY): Payer: MEDICARE | Admitting: Internal Medicine

## 2018-07-05 DIAGNOSIS — Z8546 Personal history of malignant neoplasm of prostate: Secondary | ICD-10-CM | POA: Diagnosis not present

## 2018-07-05 DIAGNOSIS — J441 Chronic obstructive pulmonary disease with (acute) exacerbation: Secondary | ICD-10-CM | POA: Diagnosis not present

## 2018-07-05 DIAGNOSIS — R627 Adult failure to thrive: Secondary | ICD-10-CM | POA: Diagnosis not present

## 2018-07-05 NOTE — Progress Notes (Signed)
   NURSING HOME LOCATION:  [facility] ROOM NUMBER:  107  CODE STATUS: DNR  PCP: Bayard Beaver MD  This is a nursing facility follow up for specific acute issues of dyspnea, hypoxia, and tachycardia.  Interim medical record and care since last [place] visit was updated with review of diagnostic studies and change in clinical status since last visit were documented.  HPI: The patient was noted to be dyspneic with O2 sats in the low 80% range on 2 L.  He was increased to 5 L of oxygen with resultant O2 sat of 88%.  He was noted to have tachycardia with a pulse up to 120.  He is on aggressive pulmonary toilet treatments for advanced COPD.  He was hospitalized 2/22-2/26 with an acute exacerbation.  Chest x-ray 2/22 was personally reviewed.  This shows profound hyper inflation compatible with advanced COPD/emphysema without acute cardiopulmonary process. Palliative care consulted on the patient for failure to thrive. Imminent pursuit of Hospice care  was recommended.  Review of systems: He validates a cough productive of some thin yellow material.  He describes abdominal discomfort from coughing.  Appetite is poor  Constitutional: No fever, significant weight change, fatigue  Eyes: No redness, discharge, pain ENT/mouth: No nasal congestion,  purulent discharge, earache, change in hearing, sore throat  Cardiovascular: No chest pain, palpitations  Respiratory: No hemoptysis Gastrointestinal: No heartburn, dysphagia, nausea /vomiting, rectal bleeding, melena, change in bowels Genitourinary: No dysuria, hematuria, pyuria, incontinence, nocturia Psychiatric: No significant anxiety, depression Hematologic/lymphatic: No significant bruising, lymphadenopathy, abnormal bleeding Allergy/immunology: No itchy/watery eyes, significant sneezing, urticaria, angioedema  Physical exam:  Pertinent or positive findings: He is grossly cachectic and malnourished.  He has pattern alopecia, hair  is disheveled.  He has an unkempt mustache.  Arcus senilis is present.  He is essentially edentulous except for isolated teeth eroded to the gumline and below and 2 intact lower incisor teeth.  He is using accessory muscles for breathing including the neck and abdominal muscles.  He exhibits pursed lip breathing.  Breath sounds are decreased but he has scattered rhonchi and isolated expiratory wheezing.  The cough was rattly and nonproductive during our exam.  Heart sounds were obscured by the abnormal breath sounds.  Abdomen is scaphoid and firm.  There is dramatic subcutaneous wasting about the head as well as profound limb atrophy.  Clubbing of the nailbeds is noted.  DNR bracelet at right wrist.  Pedal pulses are decreased.  He is diffusely profoundly weak.  He has splotchy irregular vitiligo over the right lower extremity laterally  Lymphatic: No lymphadenopathy about the head, neck, axilla. Eyes: No conjunctival inflammation or lid edema is present. There is no scleral icterus. Ears:  External ear exam shows no significant lesions or deformities.   Nose:  External nasal examination shows no deformity or inflammation. Nasal mucosa are pink and moist without lesions, exudates Oral exam:  Lips and gums are healthy appearing. Neck:  No thyromegaly, masses, tenderness noted.    Heart:  No murmur, click, rub .  Abdomen: Bowel sounds are normal. Abdomen is soft and nontender with no organomegaly, hernias, masses. GU: Deferred  Extremities:  No cyanosis, edema  Neurologic exam : Skin: Warm & dry w/o tenting. No significant rash.  See summary under each active problem in the Problem List with associated updated therapeutic plan

## 2018-07-05 NOTE — Assessment & Plan Note (Addendum)
Schedule DuoNeb treatments 4 times daily for 5 days.  Prednisone 20 mg twice daily for 5 days.  Levaquin 750 mg daily for x5 days. Assess response to pulmonary toilet, antibiotics & steroids. Consider sublingual MS for respiratory distress.

## 2018-07-05 NOTE — Patient Instructions (Signed)
See assessment and plan under each diagnosis in the problem list and acutely for this visit 

## 2018-07-06 ENCOUNTER — Encounter: Payer: Self-pay | Admitting: Internal Medicine

## 2018-07-06 NOTE — Assessment & Plan Note (Signed)
06/24/2018 PSA 0.07; recurrent prostate cancer not suggested

## 2018-07-06 NOTE — Assessment & Plan Note (Signed)
Palliative Care consult F/U. Patient Hospice appropriate.

## 2018-07-07 LAB — BASIC METABOLIC PANEL
BUN: 15 (ref 4–21)
Creatinine: 0.6 (ref 0.6–1.3)
Glucose: 93
Potassium: 4.9 (ref 3.4–5.3)
Sodium: 138 (ref 137–147)

## 2018-07-07 LAB — CBC AND DIFFERENTIAL
HCT: 35 — AB (ref 41–53)
Hemoglobin: 11.6 — AB (ref 13.5–17.5)
Platelets: 164 (ref 150–399)
WBC: 4.4

## 2018-07-20 ENCOUNTER — Encounter: Payer: Self-pay | Admitting: Internal Medicine

## 2018-07-20 ENCOUNTER — Non-Acute Institutional Stay (SKILLED_NURSING_FACILITY): Payer: MEDICARE | Admitting: Internal Medicine

## 2018-07-20 DIAGNOSIS — G40909 Epilepsy, unspecified, not intractable, without status epilepticus: Secondary | ICD-10-CM

## 2018-07-20 DIAGNOSIS — R627 Adult failure to thrive: Secondary | ICD-10-CM

## 2018-07-20 DIAGNOSIS — J449 Chronic obstructive pulmonary disease, unspecified: Secondary | ICD-10-CM | POA: Diagnosis not present

## 2018-07-20 DIAGNOSIS — Z8546 Personal history of malignant neoplasm of prostate: Secondary | ICD-10-CM | POA: Diagnosis not present

## 2018-07-20 NOTE — Progress Notes (Signed)
Location:    [facility] Room Number: 107/A Place of Service:  SNF [phone]) Provider:  Granville Lewis PA-C  Clovia Cuff, MD  Patient Care Team: Clovia Cuff, MD as PCP - General (Internal Medicine)  Extended Emergency Contact Information Primary Emergency Contact: Titus Mould Mobile Phone: [phone] Relation: Daughter Secondary Emergency Contact: [place] Phone: [phone] Mobile Phone: [phone] Relation: Spouse  Code Status:  DNR Goals of care: Advanced Directive information Advanced Directives 07/20/2018  Does Patient Have a Medical Advance Directive? Yes  Type of Advance Directive Out of facility DNR (pink MOST or yellow form)  Does patient want to make changes to medical advance directive? No - Patient declined  Would patient like information on creating a medical advance directive? No - Patient declined   Chief complaint routine visit for medical management of chronic medical conditions including advanced COPD- seizure disorder-failure to thrive- history of prostate cancer    HPI:  Pt is a 85 y.o. male seen today for medical management of chronic diseases.  As noted above. 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  He was hospitalized in late February with an acute exacerbation.  Palliative care consult for failure to thrive apparently was recommended I did check with the nursing staff and they are checking on the status of this.  Currently he appears to be at baseline relatively speaking he continues on chronic oxygen.  He was treated in the facility recently with aggressive pulmonary toilet occluding duo nebs Levaquin prednisone.  He continues on Mucinex as well as Brovana and Budesonide--in addition to Mucinex and PRN nebulizers.  Currently he rests in bed comfortably has no acute complaints does not complain of chest pain  or shortness of breath.  He is on supplementation for failure to thrive.  he is lost about 6% of his weight over the past month this has been addressed by dietary and his Magic cup is now twice daily as well as on med Pass 4 times a day.  He is also on Remeron for appetite stimulation regards to seizure disorder this is been stable during his stay here he is on Dilantin as well as Keppra.       Past Medical History:  Diagnosis Date  . Asthma   . Emphysema of lung ([place])   . Prostate cancer ([place])   . Seizures ([place])    Past Surgical History:  Procedure Laterality Date  . HERNIA REPAIR      No Known Allergies  Allergies as of 07/20/2018   No Known Allergies     Medication List       Accurate as of July 20, 2018 10:24 AM. Always use your most recent med list.        acetaminophen 325 MG tablet Commonly known as:  TYLENOL Take 650 mg by mouth every 6 (six) hours. For pain   arformoterol 15 MCG/2ML Nebu Commonly known as:  BROVANA Take 2 mLs (15 mcg total) by nebulization daily.   aspirin EC 81 MG tablet Take 1 tablet (81 mg total) by mouth daily. With Breakfast   bisacodyl 10 MG suppository Commonly known as:  DULCOLAX Constipation (2 of 4): If not relieved by MOM, give 10 mg Bisacodyl suppositiory rectally X 1 dose in 24 hours as needed (Do not use constipation standing orders for residents with renal failure/CFR less than 30. Contact MD for orders)  budesonide 0.25 MG/2ML nebulizer solution Commonly known as:  PULMICORT 0.25 mg. INHALE 1 VIAL VIA NEBULIZER ONCE DAILY FOR COPD   furosemide 20 MG tablet Commonly known as:  LASIX Take 20 mg by mouth daily as needed for edema.   guaiFENesin 600 MG 12 hr tablet Commonly known as:  MUCINEX Take 1 tablet (600 mg total) by mouth 2 (two) times daily.   ipratropium-albuterol 0.5-2.5 (3) MG/3ML Soln Commonly known as:  DUONEB Take 3 mLs by nebulization every 4 (four) hours as needed.   levETIRAcetam 1000 MG tablet  Commonly known as:  KEPPRA Take 1,000 mg by mouth daily.   magnesium hydroxide 400 MG/5ML suspension Commonly known as:  MILK OF MAGNESIA Constipation (1 of 4): If no BM in 3 days, give 30 cc Milk of Magnesium p.o. x 1 dose in 24 hours as needed (Do not use standing constipation orders for residents with renal failure CFR less than 30. Contact MD for orders)   mirtazapine 15 MG tablet Commonly known as:  REMERON Take 7.5 mg by mouth at bedtime.   multivitamin with minerals Tabs tablet Take 1 tablet by mouth daily.   NON FORMULARY Add magic cup one a day to promote weight gain.   NON FORMULARY Give 120 ML MED PASS QID TO PROMOTE WEIGHT GAIN AND WOUND HEALING   omeprazole 20 MG capsule Commonly known as:  PriLOSEC Take 1 capsule (20 mg total) by mouth daily.   OXYGEN Nasal O2 2/ min for hypoxia   phenytoin 100 MG ER capsule Commonly known as:  DILANTIN Take 1 capsule (100 mg total) by mouth 2 (two) times daily.   polyethylene glycol packet Commonly known as:  MIRALAX / GLYCOLAX Take 17 g by mouth daily as needed for mild constipation.   RA SALINE ENEMA RE Constipation (3 of 4): If not relieved by Biscodyl suppository, give disposable Saline Enema rectally X 1 dose/24 hrs as needed (Do not use constipation standing orders for residents with renal failure/CFR less than 30. Contact MD for orders)    Constipation (4 of 4): Contact MD as needed if no results from enema (Nursing Measure)   Vitamin D3 25 MCG (1000 UT) Caps Take 1,000 Units by mouth daily at 12 noon.       Review of Systems   In general he is not complaining of any fever chills feels he has a pretty good appetite.  Skin does not complain of rashes or itching.  Head ears eyes nose mouth and throat is not complain of visual changes or sore throat.  Respiratory does not complain of shortness of breath or cough he is on chronic oxygen end-stage COPD  Cardiac does not complain of chest pain or edema.  GI  denies abdominal pain nausea vomiting diarrhea or constipation he thinks his appetite has improved  GU denies dysuria.  Musculoskeletal has general frailty but does not complain of pain.  Neurologic is positive for generalized weakness he does not complain of syncope dizziness or headache.  Psych continues to be pleasant disposition does not complain of being depressed or anxious   There is no immunization history on file for this patient. Pertinent  Health Maintenance Due  Topic Date Due  . INFLUENZA VACCINE  07/22/2018 (Originally 11/25/2017)  . PNA vac Low Risk Adult (1 of 2 - PCV13) 07/22/2018 (Originally [birth year]1993)   Fall Risk  01/26/2018  Falls in the past year? No   Functional Status Survey:    He is afebrile pulse is  96 respirations 17 blood pressure 125/84--oxygen saturation is 100% on oxygen  Physical Exam   In general this is a pleasant very frail-appearing elderly male in no distress  His skin is warm and dry he does have some scaliness to his feet bilaterally.  Eyes he does have arcus senilis visual acuity appears grossly intact.  Oropharynx is clear mucous membranes moist he has few remaining teeth.  Chest is clear to auscultation with shallow air entry could not really appreciate any overt congestion there is no labored breathing   heart is regular rate and rhythm without murmur gallop or rub he does not have significant edema  Abdomen is soft nontender with positive bowel sounds  Musculoskeletal general frailty limited exam since he is in bed but is able to move extremities x4 with generalized weakness  Neurologic as noted above could not appreciate lateralizing findings his speech is clear  Psych he is pleasant and appropriate     Labs reviewed:  July 07, 2018.  WBC 4.4 hemoglobin 11.6 platelets 164.  Sodium 138 potassium 4.9 BUN 14.6 creatinine 0.55 CO2 level was 40.   Recent Labs    06/14/18 1447 06/15/18 0302 06/15/18 1057 06/18/18  0812 06/18/18 0828 06/18/18 1043 06/21/18 0956  NA  --  139  --  139 135 136 138  K  --  4.3  --  4.6 4.6 4.1 4.6  CL  --  92*  --  91*  --   --  89*  CO2  --  42*  --  42*  --   --  42*  GLUCOSE  --  82  --  107*  --   --  96  BUN  --  11  --  13  --   --  12  CREATININE  --  0.57* 0.61 0.59*  --   --  0.61  CALCIUM  --  8.9  --  8.9  --   --  9.0  MG 1.7  --   --   --   --   --   --   PHOS 2.5  --   --   --   --   --   --    Recent Labs    06/14/18 0906 06/18/18 0812 06/21/18 0956  AST 25 28 25   ALT 12 14 14   ALKPHOS 88 73 66  BILITOT 0.4 0.3 0.4  PROT 7.4 7.1 6.7  ALBUMIN 3.7 3.6 3.2*   Recent Labs    06/14/18 0906 06/15/18 0302  06/18/18 0812  06/18/18 1043 06/19/18 0436 06/21/18 0956  WBC 3.9* 2.9*   < > 4.7  --   --  3.6* 3.3*  NEUTROABS 2.8 1.5*  --  3.1  --   --   --   --   HGB 12.0* 10.4*   < > 12.0*   < > 10.5* 10.0* 11.2*  HCT 40.9 34.4*   < > 40.8   < > 31.0* 33.2* 36.9*  MCV 97.8 94.2   < > 97.6  --   --  93.8 92.9  PLT 134* 110*   < > 118*  --   --  110* 142*   < > = values in this interval not displayed.   Lab Results  Component Value Date   TSH 2.47 01/26/2018   No results found for: HGBA1C No results found for: CHOL, HDL, LDLCALC, LDLDIRECT, TRIG, CHOLHDL  Significant Diagnostic Results in last 30 days:  No results  found.  Assessment/Plan  #1 history of advanced COPD-continues on chronic oxygen at this point appears relatively stable but he is very fragile in this regard is on Mucinex and pulmonary toilet as noted above-  There has been consideration for palliative possible hospice care-nursing is looking into follow-up on this-.  Will write an order to try to keep oxygen saturations in the low 90s and will update a metabolic panel.  2.-  History of seizure disorder this is been stable he is on Keppra and Dilantin.  3 history of prostate cancer PSA was 0.07 on recent lab which does not suggest recurrence.  4.  History of failure to  thrive this continues to be a challenge is on med Pass and Magic cup he has lost about 6% of his weight over the past--- Magic  cup has been increased-at this point will monitor-he is also on Remeron- he states he feels he is eating better  Also have written an order to check on the status of flu shot-- PVC 13 and Tdap  [phone]

## 2018-07-21 LAB — BASIC METABOLIC PANEL
BUN: 19 (ref 4–21)
Creatinine: 0.5 — AB (ref 0.6–1.3)
Glucose: 110
Potassium: 5.2 (ref 3.4–5.3)
Sodium: 140 (ref 137–147)

## 2018-07-22 LAB — BASIC METABOLIC PANEL
BUN: 17 (ref 4–21)
BUN: 17 (ref 4–21)
Creatinine: 0.4 — AB (ref 0.6–1.3)
Creatinine: 0.4 — AB (ref 0.6–1.3)
Glucose: 87
Glucose: 9
Potassium: 5.4 — AB (ref 3.4–5.3)
Sodium: 141 (ref 137–147)

## 2018-07-27 ENCOUNTER — Non-Acute Institutional Stay (SKILLED_NURSING_FACILITY): Payer: MEDICARE | Admitting: Internal Medicine

## 2018-07-27 ENCOUNTER — Encounter: Payer: Self-pay | Admitting: Internal Medicine

## 2018-07-27 DIAGNOSIS — N472 Paraphimosis: Secondary | ICD-10-CM | POA: Diagnosis not present

## 2018-07-27 NOTE — Progress Notes (Signed)
   NURSING HOME LOCATION:  [facility] ROOM NUMBER:    CODE STATUS: Full Code Room: 107   PCP:  Verlin Fester MD  This is a nursing facility follow up for specific acute issue of penile swelling.  Interim medical record and care since last [place] visit was updated with review of diagnostic studies and change in clinical status since last visit were documented.  HPI: Staff requested an evaluation of swelling of the glans penis inferiorly noted today during cleansing. The patient is uncircumcised by his history; he denies any significant associated symptoms with the swelling.  He denies any past medical history of genitourinary issues.  His only GU symptom is frequency.  Review of systems: Staff reports that appetite is extremely poor and he continues to lose weight.  He states that his shortness of breath is chronic but stable.  He does describe intermittent suprapubic discomfort.  Constitutional: No fever, significant weight change, fatigue  Eyes: No redness, discharge, pain, vision change Cardiovascular: No chest pain, palpitations, paroxysmal nocturnal dyspnea, claudication, edema  Respiratory: No cough, sputum production, hemoptysis, significant snoring, apnea   Gastrointestinal: No heartburn, dysphagia, nausea /vomiting, rectal bleeding, melena, change in bowels Genitourinary: No dysuria, hematuria, pyuria, incontinence, nocturia Musculoskeletal: No joint stiffness, joint swelling, weakness, pain Dermatologic: No rash, pruritus Neurologic: No dizziness, headache, syncope, seizures, numbness, tingling Endocrine: No change in hair/nails, excessive thirst, excessive hunger, excessive urination (but frequency as noted) Hematologic/lymphatic: No significant bruising, lymphadenopathy, abnormal bleeding Allergy/immunology: No itchy/watery eyes, significant sneezing, urticaria, angioedema  Physical exam:  Pertinent or positive findings: Unshaven & disheveled  with  uneven beard and mustache.  He is incredibly grossly cachectic with diffuse muscle wasting involving the head, trunk, and limbs.  The temples and orbits are sunken.  The subclavian artery and its pulsations are visible.  He has only carious remains of maxillary teeth.  He has a few lower teeth which are plaque encrusted and carious.  There is a slight tachycardia; heart is heard best in the epigastrium.  He has low-grade dry rales greater on the right than the left without respiratory distress.   There is paraphimosis with edema and hypopigmentation of the foreskin of the penis inferiorly.  There is no associated pain to palpation, cellulitis or increased temperature. Pedal pulses are decreased.  The right foot is in a protective booty.  He is profoundly weak to opposition in all limbs.  General appearance:  no acute distress, increased work of breathing is present.   Lymphatic: No lymphadenopathy about the head, neck, axilla. Eyes: No conjunctival inflammation or lid edema is present. There is no scleral icterus. Ears:  External ear exam shows no significant lesions or deformities.   Nose:  External nasal examination shows no deformity or inflammation. Nasal mucosa are pink and moist without lesions, exudates Neck:  No thyromegaly, masses, tenderness noted.    Heart:  No gallop, murmur, click, rub .  Abdomen: Bowel sounds are normal. Abdomen is soft with no organomegaly, hernias, masses. GU: Deferred  Extremities:  No cyanosis, clubbing, edema  Neurologic exam : Balance, Rhomberg, finger to nose testing could not be completed due to clinical state Skin: Warm & dry    See summary under each active problem in the Problem List with associated updated therapeutic plan

## 2018-07-27 NOTE — Patient Instructions (Addendum)
As per review of Up to Date ,ACE wrapping for up to 7 minutes or Co-flex wrap to 20 minutes will be employed following oral pain medication.  [facility] referral will be made for worrisome findings of anuria or necrotic skin changes

## 2018-07-28 LAB — BASIC METABOLIC PANEL  EGFR
Calcium: 9.3
Calcium: 9.2
Carbon Dioxide, Total: 40
Carbon Dioxide, Total: 42
Chloride: 92
Chloride: 93

## 2018-07-29 ENCOUNTER — Encounter: Payer: Self-pay | Admitting: Internal Medicine

## 2018-07-29 ENCOUNTER — Non-Acute Institutional Stay (SKILLED_NURSING_FACILITY): Payer: MEDICARE | Admitting: Internal Medicine

## 2018-07-29 DIAGNOSIS — N472 Paraphimosis: Secondary | ICD-10-CM | POA: Diagnosis not present

## 2018-07-29 DIAGNOSIS — J449 Chronic obstructive pulmonary disease, unspecified: Secondary | ICD-10-CM | POA: Diagnosis not present

## 2018-07-29 NOTE — Progress Notes (Signed)
Location:    [facility] Room Number: 107/A Place of Service:  SNF [phone]) Provider:  Kristopher Glee, MD  Patient Care Team: Clovia Cuff, MD as PCP - General (Internal Medicine)  Extended Emergency Contact Information Primary Emergency Contact: Titus Mould Mobile Phone: [phone] Relation: Daughter Secondary Emergency Contact: [place] Phone: [phone] Mobile Phone: [phone] Relation: Spouse  Code Status:  DNR Goals of care: Advanced Directive information Advanced Directives 07/29/2018  Does Patient Have a Medical Advance Directive? Yes  Type of Advance Directive Out of facility DNR (pink MOST or yellow form)  Does patient want to make changes to medical advance directive? No - Patient declined  Would patient like information on creating a medical advance directive? No - Patient declined     Chief Complaint  Patient presents with  . Acute Visit    F/U Paraphimosis    HPI:  Pt is a 85 y.o. male seen today for an acute visit for follow-up of paraphimosis.  Patient was seen earlier this week for a swollen glans penis inferiorly.  Continues to be able to urinate and is not really complaining of pain in this regards.  Patient has had previously he has been uncircumcised-. Dr. Linna Darner did see him a couple days ago and ordered a strapping for up to 7 minutes or Coflex wrap for 20 minutes along with pain medication.  He continues to deny any pain he is able to urinate it appears vital signs appear to be stable.  He still has continued swelling    Past Medical History:  Diagnosis Date  . Asthma   . Emphysema of lung ([place])   . Prostate cancer ([place])   . Seizures ([place])    Past Surgical History:  Procedure Laterality Date  . HERNIA REPAIR      No Known Allergies  Outpatient Encounter Medications as of 07/29/2018  Medication Sig  . acetaminophen (TYLENOL) 325 MG tablet Take 650 mg by  mouth every 6 (six) hours. For pain  . aspirin EC 81 MG tablet Take 1 tablet (81 mg total) by mouth daily. With Breakfast  . bisacodyl (DULCOLAX) 10 MG suppository Constipation (2 of 4): If not relieved by MOM, give 10 mg Bisacodyl suppositiory rectally X 1 dose in 24 hours as needed (Do not use constipation standing orders for residents with renal failure/CFR less than 30. Contact MD for orders)  . budesonide (PULMICORT) 0.25 MG/2ML nebulizer solution 0.25 mg. INHALE 1 VIAL VIA NEBULIZER ONCE DAILY FOR COPD  . Cholecalciferol (VITAMIN D3) 1000 units CAPS Take 1,000 Units by mouth daily at 12 noon.   . furosemide (LASIX) 20 MG tablet Take 20 mg by mouth daily as needed for edema.  Marland Kitchen guaiFENesin (MUCINEX) 600 MG 12 hr tablet Take 1 tablet (600 mg total) by mouth 2 (two) times daily.  Marland Kitchen ipratropium-albuterol (DUONEB) 0.5-2.5 (3) MG/3ML SOLN Take 3 mLs by nebulization every 4 (four) hours as needed.  . levETIRAcetam (KEPPRA) 1000 MG tablet Take 1,000 mg by mouth daily.  . magnesium hydroxide (MILK OF MAGNESIA) 400 MG/5ML suspension Constipation (1 of 4): If no BM in 3 days, give 30 cc Milk of Magnesium p.o. x 1 dose in 24 hours as needed (Do not use standing constipation orders for residents with renal failure CFR less than 30. Contact MD for orders)  . mirtazapine (REMERON) 15 MG tablet Take 7.5 mg by mouth at bedtime.   . Multiple Vitamin (MULTIVITAMIN WITH  MINERALS) TABS tablet Take 1 tablet by mouth daily.  . NON FORMULARY Add magic cup one a day to promote weight gain.  . NON FORMULARY Give 120 ML MED PASS QID TO PROMOTE WEIGHT GAIN AND WOUND HEALING  . omeprazole (PRILOSEC) 20 MG capsule Take 1 capsule (20 mg total) by mouth daily.  . OXYGEN Nasal O2 2/ min for hypoxia  . phenytoin (DILANTIN) 100 MG ER capsule Take 1 capsule (100 mg total) by mouth 2 (two) times daily.  . polyethylene glycol (MIRALAX / GLYCOLAX) packet Take 17 g by mouth daily as needed for mild constipation.  . Sodium  Phosphates (RA SALINE ENEMA RE) Constipation (3 of 4): If not relieved by Biscodyl suppository, give disposable Saline Enema rectally X 1 dose/24 hrs as needed (Do not use constipation standing orders for residents with renal failure/CFR less than 30. Contact MD for orders)    Constipation (4 of 4): Contact MD as needed if no results from enema (Nursing Measure)  . traMADol (ULTRAM) 50 MG tablet 50 mg. Give 1 PO 30 mins prior to wound care & every 6 hours prn x 4 doses.  . [DISCONTINUED] arformoterol (BROVANA) 15 MCG/2ML NEBU Take 2 mLs (15 mcg total) by nebulization daily.   No facility-administered encounter medications on file as of 07/29/2018.     Review of Systems General does not complain of fever chills.  Skin does not complain of rashes itching or diaphoresis.  Head ears eyes nose mouth and throat does not complain of visual changes or sore throat.  Respiratory is not complaining of shortness of breath beyond baseline cough.  Cardiac does not complain of chest pain or edema.  GI continues to have a poor appetite but does not complain of abdominal pain nausea or vomiting.  GU continues with inferior penis swelling-does not complain of pain however or significant dysuria he appears able to urinate-does not really complain of suprapubic pain  Musculoskeletal-continues with extensive weakness does not complain of joint pain.  Neurologic does not complain of dizziness headache or syncope  Psych does not complain of overt anxiety or depression  There is no immunization history on file for this patient. Pertinent  Health Maintenance Due  Topic Date Due  . PNA vac Low Risk Adult (1 of 2 - PCV13) 08/28/2018 (Originally 12/06/1991)  . INFLUENZA VACCINE  11/26/2018   Fall Risk  01/26/2018  Falls in the past year? No   Functional Status Survey:    Temperature is 98.0 pulse is 98 respirations 19 blood pressure 112/61 O2 saturation is 94%  Physical Exam   In general this is a  frail appearing elderly male who does not appear to be in any distress.  His skin is warm and dry.  Oropharynx is clear he has poor dentition with numerous extractions.  Eyes visual acuity appears to be intact.  Chest he has shallow air entry with no labored breathing there are  a few scattered rales  Abdomen is cathartic soft nontender with positive bowel sounds.  GU could not really appreciate suprapubic tenderness or distention- he continues to have swelling of the glans penis inferior aspect with hypopigmentation-I do not see any drainage or bleeding he appears able to urinate his diaper is wet  Musculoskeletal has generalized weakness with diffuse muscle atrophy-.  Neurologic he is alert could not really appreciate lateralizing findings his speech is clear but he does not speak a lot.  Psych he appears alert and oriented pleasant and appropriate.  Labs reviewed: Recent Labs    06/14/18 1447 06/15/18 0302  06/18/18 0812  06/21/18 0956 07/07/18 07/21/18 07/22/18  NA  --  139  --  139   < > 138 138 140 141  K  --  4.3  --  4.6   < > 4.6 4.9 5.2 5.4*  CL  --  92*  --  91*  --  89* 92  --  93  CO2  --  42*  --  42*  --  42* 40  --  42  GLUCOSE  --  82  --  107*  --  96  --   --   --   BUN  --  11  --  13  --  12 15 19 17  17   CREATININE  --  0.57*   < > 0.59*  --  0.61 0.6 0.5* 0.4*  0.4*  CALCIUM  --  8.9  --  8.9  --  9.0 9.3  --  9.2  MG 1.7  --   --   --   --   --   --   --   --   PHOS 2.5  --   --   --   --   --   --   --   --    < > = values in this interval not displayed.   Recent Labs    06/14/18 0906 06/18/18 0812 06/21/18 0956  AST 25 28 25   ALT 12 14 14   ALKPHOS 88 73 66  BILITOT 0.4 0.3 0.4  PROT 7.4 7.1 6.7  ALBUMIN 3.7 3.6 3.2*   Recent Labs    06/14/18 0906 06/15/18 0302  06/18/18 0812  06/19/18 0436 06/21/18 0956 07/07/18  WBC 3.9* 2.9*   < > 4.7  --  3.6* 3.3* 4.4  NEUTROABS 2.8 1.5*  --  3.1  --   --   --   --   HGB 12.0* 10.4*   < >  12.0*   < > 10.0* 11.2* 11.6*  HCT 40.9 34.4*   < > 40.8   < > 33.2* 36.9* 35*  MCV 97.8 94.2   < > 97.6  --  93.8 92.9  --   PLT 134* 110*   < > 118*  --  110* 142* 164   < > = values in this interval not displayed.   Lab Results  Component Value Date   TSH 2.47 01/26/2018   No results found for: HGBA1C No results found for: CHOL, HDL, LDLCALC, LDLDIRECT, TRIG, CHOLHDL  Significant Diagnostic Results in last 30 days:  No results found.  Assessment/Plan  #1 history of paraphimosis-he does have orders for compression wrap-I did speak with wound care nursing and they were wondering if they could try a sugar treatment which apparently has been tried in the past for swelling-I did discuss this with Dr. Linna Darner and he is open to this- and when wound care nurse is available well discuss this with her to start this treatment if possible.  Currently he appears to be in no distress appears able to urinate but this will have to be watched  2-also will update a metabolic panel secondary to one lab that showed a slightly elevated potassium of 5.4 will update this next laboratory day clinically he appears to be stable  #3-history of COPD continues on chronic oxygen at this point appears to be at his baseline says he does not have shortness  of breath beyond baseline he does have Mucinex as well as Pulmicort and duo nebs as needed  [id number]

## 2018-07-30 ENCOUNTER — Encounter: Payer: Self-pay | Admitting: Internal Medicine

## 2018-07-31 ENCOUNTER — Encounter: Payer: Self-pay | Admitting: Internal Medicine

## 2018-08-03 ENCOUNTER — Encounter: Payer: Self-pay | Admitting: Adult Health

## 2018-08-03 ENCOUNTER — Non-Acute Institutional Stay (SKILLED_NURSING_FACILITY): Payer: MEDICARE | Admitting: Adult Health

## 2018-08-03 DIAGNOSIS — J9620 Acute and chronic respiratory failure, unspecified whether with hypoxia or hypercapnia: Secondary | ICD-10-CM

## 2018-08-03 DIAGNOSIS — R638 Other symptoms and signs concerning food and fluid intake: Secondary | ICD-10-CM | POA: Diagnosis not present

## 2018-08-03 DIAGNOSIS — J449 Chronic obstructive pulmonary disease, unspecified: Secondary | ICD-10-CM

## 2018-08-03 DIAGNOSIS — R110 Nausea: Secondary | ICD-10-CM

## 2018-08-03 NOTE — Progress Notes (Signed)
Location:  [facility] Room Number: 107/A Place of Service:  SNF (31) Provider:  Durenda Age, NP  Patient Care Team: Clovia Cuff, MD as PCP - General (Internal Medicine)  Extended Emergency Contact Information Primary Emergency Contact: Titus Mould Mobile Phone: [phone] Relation: Daughter Secondary Emergency Contact: [place] Phone: [phone] Mobile Phone: [phone] Relation: Spouse  Code Status:  DNR  Goals of care: Advanced Directive information Advanced Directives 08/03/2018  Does Patient Have a Medical Advance Directive? Yes  Type of Advance Directive Out of facility DNR (pink MOST or yellow form)  Does patient want to make changes to medical advance directive? No - Patient declined  Would patient like information on creating a medical advance directive? No - Patient declined     Chief Complaint  Patient presents with  . Acute Visit    Nausea, SOB    HPI:  Pt is a 85 y.o. male seen today for an acute visit. Charge nurse reported that he was nauseated and has SOB. He was reported to have poor oral intake. He had 2.4 lbs weight loss in 2 weeks. He refused his Brovana this morning. His O2 sat at first , with O2 @ 3L/min continuously, was 94% and HR 120. He was immediately started on the [place] that he had refused earlier and after 5 minutes, he said he felt better. Re-check of HR 110 and O2 sat was 98%. His temperature was 97.9 F. He is currently followed up by Palliative Care. No wheezing was noted. No cough nor fever was reported.    Past Medical History:  Diagnosis Date  . Asthma   . Emphysema of lung ([place])   . Prostate cancer ([place])   . Seizures ([place])    Past Surgical History:  Procedure Laterality Date  . HERNIA REPAIR      No Known Allergies  Outpatient Encounter Medications as of 08/03/2018  Medication Sig  . acetaminophen (TYLENOL) 325 MG tablet Take 650 mg by mouth every 6 (six) hours. For  pain  . arformoterol (BROVANA) 15 MCG/2ML NEBU INHALE 1 VIAL VIA NEBULIZER ONCE DAILY FOR COPD  . aspirin EC 81 MG tablet Take 1 tablet (81 mg total) by mouth daily. With Breakfast  . bisacodyl (DULCOLAX) 10 MG suppository Constipation (2 of 4): If not relieved by MOM, give 10 mg Bisacodyl suppositiory rectally X 1 dose in 24 hours as needed (Do not use constipation standing orders for residents with renal failure/CFR less than 30. Contact MD for orders)  . budesonide (PULMICORT) 0.25 MG/2ML nebulizer solution 0.25 mg. INHALE 1 VIAL VIA NEBULIZER ONCE DAILY FOR COPD  . Cholecalciferol (VITAMIN D3) 1000 units CAPS Take 1,000 Units by mouth daily at 12 noon.   . furosemide (LASIX) 20 MG tablet Take 20 mg by mouth daily as needed for edema.  Marland Kitchen guaiFENesin (MUCINEX) 600 MG 12 hr tablet Take 1 tablet (600 mg total) by mouth 2 (two) times daily.  Marland Kitchen ipratropium-albuterol (DUONEB) 0.5-2.5 (3) MG/3ML SOLN Take 3 mLs by nebulization every 4 (four) hours as needed.  . levETIRAcetam (KEPPRA) 1000 MG tablet Take 1,000 mg by mouth daily.  . magnesium hydroxide (MILK OF MAGNESIA) 400 MG/5ML suspension Constipation (1 of 4): If no BM in 3 days, give 30 cc Milk of Magnesium p.o. x 1 dose in 24 hours as needed (Do not use standing constipation orders for residents with renal failure CFR less than 30. Contact MD for orders)  . mirtazapine (REMERON) 15 MG tablet Take 7.5  mg by mouth at bedtime.   . Multiple Vitamin (MULTIVITAMIN WITH MINERALS) TABS tablet Take 1 tablet by mouth daily.  . NON FORMULARY Add magic cup one a day to promote weight gain.  . NON FORMULARY Give 120 ML MED PASS QID TO PROMOTE WEIGHT GAIN AND WOUND HEALING  . omeprazole (PRILOSEC) 20 MG capsule Take 1 capsule (20 mg total) by mouth daily.  . OXYGEN Nasal O2 2/ min for hypoxia  . phenytoin (DILANTIN) 100 MG ER capsule Take 1 capsule (100 mg total) by mouth 2 (two) times daily.  . polyethylene glycol (MIRALAX / GLYCOLAX) packet Take 17 g by mouth  daily as needed for mild constipation.  . Sodium Phosphates (RA SALINE ENEMA RE) Constipation (3 of 4): If not relieved by Biscodyl suppository, give disposable Saline Enema rectally X 1 dose/24 hrs as needed (Do not use constipation standing orders for residents with renal failure/CFR less than 30. Contact MD for orders)    Constipation (4 of 4): Contact MD as needed if no results from enema (Nursing Measure)  . traMADol (ULTRAM) 50 MG tablet 50 mg. Give 1 PO 30 mins prior to wound care & every 6 hours prn x 4 doses.   No facility-administered encounter medications on file as of 08/03/2018.     Review of Systems  GENERAL: No fever, chills or weakness, +poor appetite MOUTH and THROAT: Denies oral discomfort, gingival pain or bleeding RESPIRATORY: no cough, DOE, wheezing, hemoptysis, + SOB CARDIAC: No chest pain, edema or palpitations GI: + nausea GU: Denies dysuria, frequency, hematuria, or discharge NEUROLOGICAL: Denies dizziness, syncope, numbness, or headache PSYCHIATRIC: Denies feelings of depression or anxiety. No report of hallucinations, insomnia, paranoia, or agitation    There is no immunization history on file for this patient. Pertinent  Health Maintenance Due  Topic Date Due  . PNA vac Low Risk Adult (1 of 2 - PCV13) 08/28/2018 (Originally 12/06/1991)  . INFLUENZA VACCINE  11/26/2018   Fall Risk  01/26/2018  Falls in the past year? No     Vitals:   08/03/18 1412  BP: 122/71  Pulse: (!) 110  Resp: 20  Temp: 97.7 F (36.5 C)  TempSrc: Oral  SpO2: 95%  Weight: 87 lb 3.2 oz (39.6 kg)  Height: 5\' 6"  (1.676 m)   Body mass index is 14.07 kg/m.  Physical Exam  GENERAL APPEARANCE: Thin built SKIN:  Has sacral ulcer stage 2 with dressing MOUTH and THROAT: Lips are without lesions. Oral mucosa is moist and without lesions. Tongue is normal in shape, size, and color and without lesions RESPIRATORY: Breathing is even & unlabored, BS CTAB CARDIAC: RRR, no murmur,no  extra heart sounds, no edema GI: Abdomen soft, normal BS, no masses, no tenderness NEUROLOGICAL: There is no tremor. Speech is clear. Alert to self and time, disoriented to place. PSYCHIATRIC:  Affect and behavior are appropriate    Labs reviewed: Recent Labs    06/14/18 1447 06/15/18 0302  06/18/18 0812  06/21/18 0956 07/07/18 07/21/18 07/22/18  NA  --  139  --  139   < > 138 138 140 141  K  --  4.3  --  4.6   < > 4.6 4.9 5.2 5.4*  CL  --  92*  --  91*  --  89* 92  --  93  CO2  --  42*  --  42*  --  42* 40  --  42  GLUCOSE  --  82  --  107*  --  96  --   --   --   BUN  --  11  --  13  --  12 15 19 17  17   CREATININE  --  0.57*   < > 0.59*  --  0.61 0.6 0.5* 0.4*  0.4*  CALCIUM  --  8.9  --  8.9  --  9.0 9.3  --  9.2  MG 1.7  --   --   --   --   --   --   --   --   PHOS 2.5  --   --   --   --   --   --   --   --    < > = values in this interval not displayed.   Recent Labs    06/14/18 0906 06/18/18 0812 06/21/18 0956  AST 25 28 25   ALT 12 14 14   ALKPHOS 88 73 66  BILITOT 0.4 0.3 0.4  PROT 7.4 7.1 6.7  ALBUMIN 3.7 3.6 3.2*   Recent Labs    06/14/18 0906 06/15/18 0302  06/18/18 0812  06/19/18 0436 06/21/18 0956 07/07/18  WBC 3.9* 2.9*   < > 4.7  --  3.6* 3.3* 4.4  NEUTROABS 2.8 1.5*  --  3.1  --   --   --   --   HGB 12.0* 10.4*   < > 12.0*   < > 10.0* 11.2* 11.6*  HCT 40.9 34.4*   < > 40.8   < > 33.2* 36.9* 35*  MCV 97.8 94.2   < > 97.6  --  93.8 92.9  --   PLT 134* 110*   < > 118*  --  110* 142* 164   < > = values in this interval not displayed.   Lab Results  Component Value Date   TSH 2.47 01/26/2018     Assessment/Plan  1. Acute on chronic respiratory failure, unspecified whether with hypoxia or hypercapnia ([place]) - thought to be due to missed dose of Brovana, will add Duoneb neb Q8 hours X 5 days and PRN, O2 @ 3L/min via [place] continuously  2. Chronic obstructive pulmonary disease, unspecified COPD type ([place]) -Continue Brovana, budesonide and DuoNeb   3. Poor fluid intake Wt Readings from Last 3 Encounters:  08/03/18 87 lb 3.2 oz (39.6 kg)  06/18/18 98 lb 12.3 oz (44.8 kg)  06/14/18 98 lb 12.3 oz (44.8 kg)  - Body mass index is 14.07 kg/m. - will start D5.9 NS at 70 ml/hr X 1L, continue mirtazapine 7.5 mg 1 tab at bedtime to increase appetite, Magic cup twice daily and med Pass 120 mL 4 times daily  4. Nausea - will start Zofran 4 mg 1 tab every 8 hours as needed   Family/ staff Communication: Discussed plan of care with resident and charge nurse.  Wife was updated by charge nurse on the phone.  Labs/tests ordered:   None  Goals of care:   Long-term care   Durenda Age, NP [facility] [phone] (Monday-Friday 8:00 a.m. - 5:00 p.m.) [phone] (after hours)

## 2018-08-05 ENCOUNTER — Encounter: Payer: Self-pay | Admitting: Internal Medicine

## 2018-08-05 DIAGNOSIS — Z7189 Other specified counseling: Secondary | ICD-10-CM | POA: Insufficient documentation

## 2018-08-08 ENCOUNTER — Encounter: Payer: Self-pay | Admitting: Internal Medicine

## 2018-08-08 ENCOUNTER — Non-Acute Institutional Stay (SKILLED_NURSING_FACILITY): Payer: MEDICARE | Admitting: Internal Medicine

## 2018-08-08 DIAGNOSIS — K625 Hemorrhage of anus and rectum: Secondary | ICD-10-CM

## 2018-08-08 DIAGNOSIS — Q998 Other specified chromosome abnormalities: Secondary | ICD-10-CM

## 2018-08-08 DIAGNOSIS — J449 Chronic obstructive pulmonary disease, unspecified: Secondary | ICD-10-CM

## 2018-08-08 NOTE — Progress Notes (Signed)
Location:    [place] Room Number: 107/A Place of Service:  SNF [phone]) Provider:  Kristopher Glee, MD  Patient Care Team: Clovia Cuff, MD as PCP - General (Internal Medicine)  Extended Emergency Contact Information Primary Emergency Contact: Titus Mould Mobile Phone: [phone] Relation: Daughter Secondary Emergency Contact: [place] Phone: [phone] Mobile Phone: [phone] Relation: Spouse  Code Status:  DNR Goals of care: Advanced Directive information Advanced Directives 08/08/2018  Does Patient Have a Medical Advance Directive? Yes  Type of Advance Directive Out of facility DNR (pink MOST or yellow form)  Does patient want to make changes to medical advance directive? No - Patient declined  Would patient like information on creating a medical advance directive? No - Patient declined   Complaint t-acute visit for follow-up possible rectal bleeding-also failure to thrive    HPI:  Pt is a 85 y.o. male seen today for an acute visit for follow-up of small rectal bleeding late last week as well as failure to thrive.  Patient is very frail and has lost it appears more than 10 pounds over the past couple months.  He is on supplements including Magic cup and med Pass- he is also on Remeron for appetite stimulation.  His other diagnoses include COPD with history of exacerbation continues on Brovana as well as budesonide in addition to nebulizers as needed and Mucinex as needed.    Apparently Friday he was found to have some blood in his diaper this was thought to be rectal bleeding.  Did not complain of any pain-.  Dr. Linna Darner did speak with his responsible party via phone- desires appear to be more for comfort measure with no aggressive interventions or labs.  We did discontinue his aspirin he is on a proton pump inhibitor  He is also been followed for paraphimosis- continues to have some  edema he is not complaining of any pain and apparently is able to urinate without difficulty   He did receive a liter of fluids last week for concerns of weight loss- again desires now or from essentially comfort measures with no aggressive interventions.  We will remove the IV line-in fact his only complaint today is it feels a little sore there  Vital signs appear to be stable apparently overnight there was a temperature of 99.9 but he has been afebrile today with temperature in the 98 range he is not complaining of any increased cough baseline or dysuria fever or chills.  Continues to be very frail however    Past Medical History:  Diagnosis Date  . Asthma   . Emphysema of lung ([place])   . Prostate cancer ([place])   . Seizures ([place])    Past Surgical History:  Procedure Laterality Date  . HERNIA REPAIR      No Known Allergies  Outpatient Encounter Medications as of 08/08/2018  Medication Sig  . acetaminophen (TYLENOL) 325 MG tablet Take 650 mg by mouth every 6 (six) hours. For pain  . arformoterol (BROVANA) 15 MCG/2ML NEBU INHALE 1 VIAL VIA NEBULIZER ONCE DAILY FOR COPD  . bisacodyl (DULCOLAX) 10 MG suppository Constipation (2 of 4): If not relieved by MOM, give 10 mg Bisacodyl suppositiory rectally X 1 dose in 24 hours as needed (Do not use constipation standing orders for residents with renal failure/CFR less than 30. Contact MD for orders)  . budesonide (PULMICORT) 0.25 MG/2ML nebulizer solution 0.25 mg. INHALE 1 VIAL VIA NEBULIZER ONCE DAILY  FOR COPD  . Cholecalciferol (VITAMIN D3) 1000 units CAPS Take 1,000 Units by mouth daily at 12 noon.   . furosemide (LASIX) 20 MG tablet Take 20 mg by mouth daily as needed for edema.  Marland Kitchen guaiFENesin (MUCINEX) 600 MG 12 hr tablet Take 1 tablet (600 mg total) by mouth 2 (two) times daily.  Marland Kitchen ipratropium-albuterol (DUONEB) 0.5-2.5 (3) MG/3ML SOLN Take 3 mLs by nebulization every 4 (four) hours as needed.  . levETIRAcetam (KEPPRA) 1000 MG tablet  Take 1,000 mg by mouth daily.  . magnesium hydroxide (MILK OF MAGNESIA) 400 MG/5ML suspension Constipation (1 of 4): If no BM in 3 days, give 30 cc Milk of Magnesium p.o. x 1 dose in 24 hours as needed (Do not use standing constipation orders for residents with renal failure CFR less than 30. Contact MD for orders)  . mirtazapine (REMERON) 15 MG tablet Take 7.5 mg by mouth at bedtime.   . Multiple Vitamin (MULTIVITAMIN WITH MINERALS) TABS tablet Take 1 tablet by mouth daily.  . NON FORMULARY Add magic cup one a day to promote weight gain.  . NON FORMULARY Give 120 ML MED PASS QID TO PROMOTE WEIGHT GAIN AND WOUND HEALING  . omeprazole (PRILOSEC) 20 MG capsule Take 1 capsule (20 mg total) by mouth daily.  . ondansetron (ZOFRAN) 4 MG tablet Take 4 mg by mouth every 8 (eight) hours as needed for nausea or vomiting.  . OXYGEN Nasal O2 2/ min for hypoxia  . phenytoin (DILANTIN) 100 MG ER capsule Take 1 capsule (100 mg total) by mouth 2 (two) times daily.  . polyethylene glycol (MIRALAX / GLYCOLAX) packet Take 17 g by mouth daily as needed for mild constipation.  . Sodium Phosphates (RA SALINE ENEMA RE) Constipation (3 of 4): If not relieved by Biscodyl suppository, give disposable Saline Enema rectally X 1 dose/24 hrs as needed (Do not use constipation standing orders for residents with renal failure/CFR less than 30. Contact MD for orders)    Constipation (4 of 4): Contact MD as needed if no results from enema (Nursing Measure)  . traMADol (ULTRAM) 50 MG tablet 50 mg. Give 1 PO 30 mins prior to wound care & every 6 hours prn x 4 doses.  . [DISCONTINUED] aspirin EC 81 MG tablet Take 1 tablet (81 mg total) by mouth daily. With Breakfast   No facility-administered encounter medications on file as of 08/08/2018.     Review of Systems In general he is not complaining of fever chills his only complaint is he has some discomfort where the IV has been inserted left arm  Skin does not complain of rashes or  itching or diaphoresis.  Head ears eyes nose mouth and throat is not complain of visual changes or sore throat  Respiratory does not complain of shortness of breath from baseline or increased cough from baseline   Cardiac is not complaining of chest pain or edema.  GI is not complaining of abdominal discomfort nausea vomiting diarrhea or constipation apparently there is been some small amount of rectal bleeding Friday but no reoccurrence that I am aware of.  GU again significant for paraphimosis but he is not complaining of difficulty urinating or dysuria.  Musculoskeletal continues with significant frailty but does not complain of joint pain.  Neurologic positive for weakness is not complaining of feeling dizzy or having a headache.  Psych continues to be very pleasant is not complaining being depressed or anxious    There is no immunization history on  file for this patient. Pertinent  Health Maintenance Due  Topic Date Due  . PNA vac Low Risk Adult (1 of 2 - PCV13) 08/28/2018 (Originally 12/06/1991)  . INFLUENZA VACCINE  11/26/2018   Fall Risk  01/26/2018  Falls in the past year? No   Functional Status Survey:  Temperature is 98.6 pulse is 104 operations of 20 blood pressure   Body mass index is 14.07 kg/m. Physical Exam   General this is a frail elderly male who appears relatively at his baseline appears to be quite weak.  His skin is warm and dry he does have IV site left lower arm I do not note any increased edema or erythema  Eyes visual acuity appears grossly intact he has arcus senilis.  Oropharynx is clear mucous membranes actually appears fairly moist.  Chest shallow air entry but could not really appreciate any and there is no labored breathing.  Heart is regular rate and rhythm without murmur gallop or rub it is slightly tachycardic at just over 100.  He does not have significant lower extremity edema.  Abdomen is soft nontender with positive bowel sounds     musculoskeletal continues with general frailty does have protective boots on his feet bilaterally  Neurologic continues with generalized weakness but could not really appreciate lateralizing findings   psych he is pleasant and appropriate appears largely oriented  Labs reviewed: Recent Labs    06/14/18 1447 06/15/18 0302  06/18/18 0812  06/21/18 0956 07/07/18 07/21/18 07/22/18  NA  --  139  --  139   < > 138 138 140 141  K  --  4.3  --  4.6   < > 4.6 4.9 5.2 5.4*  CL  --  92*  --  91*  --  89* 92  --  93  CO2  --  42*  --  42*  --  42* 40  --  42  GLUCOSE  --  82  --  107*  --  96  --   --   --   BUN  --  11  --  13  --  12 15 19 17  17   CREATININE  --  0.57*   < > 0.59*  --  0.61 0.6 0.5* 0.4*  0.4*  CALCIUM  --  8.9  --  8.9  --  9.0 9.3  --  9.2  MG 1.7  --   --   --   --   --   --   --   --   PHOS 2.5  --   --   --   --   --   --   --   --    < > = values in this interval not displayed.   Recent Labs    06/14/18 0906 06/18/18 0812 06/21/18 0956  AST 25 28 25   ALT 12 14 14   ALKPHOS 88 73 66  BILITOT 0.4 0.3 0.4  PROT 7.4 7.1 6.7  ALBUMIN 3.7 3.6 3.2*   Recent Labs    06/14/18 0906 06/15/18 0302  06/18/18 0812  06/19/18 0436 06/21/18 0956 07/07/18  WBC 3.9* 2.9*   < > 4.7  --  3.6* 3.3* 4.4  NEUTROABS 2.8 1.5*  --  3.1  --   --   --   --   HGB 12.0* 10.4*   < > 12.0*   < > 10.0* 11.2* 11.6*  HCT 40.9 34.4*   < > 40.8   < >  33.2* 36.9* 35*  MCV 97.8 94.2   < > 97.6  --  93.8 92.9  --   PLT 134* 110*   < > 118*  --  110* 142* 164   < > = values in this interval not displayed.   Lab Results  Component Value Date   TSH 2.47 01/26/2018   No results found for: HGBA1C No results found for: CHOL, HDL, LDLCALC, LDLDIRECT, TRIG, CHOLHDL  Significant Diagnostic Results in last 30 days:  No results found.  Assessment/Plan  #1-history of rectal bleeding-as noted above Dr. Linna Darner has discussed patient's status with his responsible party-again emphasis is  mainly on comfort and possibly hospice consult-again this is somewhat complicated since coronavirus situation and hospice does not come into facilities currently because of this.  At this point he appears comfortable continue supportive care- we have discontinued his aspirin he is on a proton pump inhibitor will monitor for any changes but clinically appears to be at his baseline.  2.  Failure to thrive as noted above he is on numerous supplements as well as an appetite stimulant- challenging however with his frailty at this point continue comfort measures  3 COPD- continues to be significantly weak and frail in this regards but he does not complain of increased cough or shortness of breath from baseline continue Brovana as well as budesonide and PRN nebulizers.  Of note also will write orders to discontinue the IV site-he is having discomfort with this  [phone]- of no greater than 25 minutes spent assessing patient reviewing his chart and labs discussing his status with nursing staff coordinating and formulating a plan of care --of note greater than 50% of time spent coordinating a plan of care with input as noted above

## 2018-08-15 ENCOUNTER — Encounter: Payer: Self-pay | Admitting: Internal Medicine

## 2018-08-15 ENCOUNTER — Non-Acute Institutional Stay (SKILLED_NURSING_FACILITY): Payer: MEDICARE | Admitting: Internal Medicine

## 2018-08-15 DIAGNOSIS — J449 Chronic obstructive pulmonary disease, unspecified: Secondary | ICD-10-CM

## 2018-08-15 DIAGNOSIS — R627 Adult failure to thrive: Secondary | ICD-10-CM | POA: Diagnosis not present

## 2018-08-15 DIAGNOSIS — K625 Hemorrhage of anus and rectum: Secondary | ICD-10-CM | POA: Diagnosis not present

## 2018-08-15 NOTE — Progress Notes (Signed)
Location:    [facility] Room Number: 107/A Place of Service:  SNF [id number]) Provider:  Kristopher Glee, MD  Patient Care Team: Clovia Cuff, MD as PCP - General (Internal Medicine)  Extended Emergency Contact Information Primary Emergency Contact: Titus Mould Mobile Phone: [phone] Relation: Daughter Secondary Emergency Contact: [place] Phone: [phone] Mobile Phone: [phone] Relation: Spouse  Code Status:  DNR Goals of care: Advanced Directive information Advanced Directives 08/15/2018  Does Patient Have a Medical Advance Directive? Yes  Type of Advance Directive Out of facility DNR (pink MOST or yellow form)  Does patient want to make changes to medical advance directive? No - Patient declined  Would patient like information on creating a medical advance directive? No - Patient declined     Chief Complaint  Patient presents with  . Acute Visit    Weight Loss  Questions about possible PEG tube  HPI:  Pt is a 85 y.o. male seen today for an acute visit for questions about a possible PEG tube.  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  He is on numerous supplements including frequent med Pass and Magic cup as well as super potato.  And on Remeron for appetite stimulation.  He is also had a recent small amount of rectal bleeding.  Dr. Linna Darner has spoken with his daughter who apparently is the responsible party at this point emphasis is mainly on comfort secondary to his significant comorbidities.  The dietitian did leave a note about his wife asking about a possible PEG tube.  I did speak with his wife Angelita Ingles via phone-she says she does not really desire a PEG tube- she says he probably  does not need anymore surgery of any type which I certainly would agree with.  She says he is actually expressed desires for no feeding tube as well.  Currently he is lying in bed comfortably has just received a breathing treatment and apparently this is helping with his breathing.  Vital signs appear to be stable he says he eats pretty well and he does make good effort but with his comorbidities I suspect there continues to be challenges.     Past Medical History:  Diagnosis Date  . Asthma   . Emphysema of lung ([place])   . Prostate cancer ([place])   . Seizures ([place])    Past Surgical History:  Procedure Laterality Date  . HERNIA REPAIR      No Known Allergies  Outpatient Encounter Medications as of 08/15/2018  Medication Sig  . acetaminophen (TYLENOL) 325 MG tablet Take 650 mg by mouth every 6 (six) hours. For pain  . arformoterol (BROVANA) 15 MCG/2ML NEBU INHALE 1 VIAL VIA NEBULIZER ONCE DAILY FOR COPD  . bisacodyl (DULCOLAX) 10 MG suppository Constipation (2 of 4): If not relieved by MOM, give 10 mg Bisacodyl suppositiory rectally X 1 dose in 24 hours as needed (Do not use constipation standing orders for residents with renal failure/CFR less than 30. Contact MD for orders)  . budesonide (PULMICORT) 0.25 MG/2ML nebulizer solution 0.25 mg. INHALE 1 VIAL VIA NEBULIZER ONCE DAILY FOR COPD  . Cholecalciferol (VITAMIN D3) 1000 units CAPS Take 1,000 Units by mouth daily at 12 noon.   Marland Kitchen  furosemide (LASIX) 20 MG tablet Take 20 mg by mouth daily as needed for edema.  Marland Kitchen guaiFENesin (MUCINEX) 600 MG 12 hr tablet Take 1 tablet (600 mg total) by mouth 2 (two) times daily.  Marland Kitchen ipratropium-albuterol (DUONEB) 0.5-2.5 (3) MG/3ML SOLN Take 3 mLs by nebulization every 4 (four) hours as needed.  . levETIRAcetam (KEPPRA) 1000 MG tablet Take 1,000 mg by mouth daily.  . magnesium hydroxide (MILK OF MAGNESIA) 400 MG/5ML suspension Constipation (1 of 4): If no BM in 3 days, give 30 cc Milk of Magnesium p.o. x  1 dose in 24 hours as needed (Do not use standing constipation orders for residents with renal failure CFR less than 30. Contact MD for orders)  . mirtazapine (REMERON) 15 MG tablet Take 7.5 mg by mouth at bedtime.   . Multiple Vitamin (MULTIVITAMIN WITH MINERALS) TABS tablet Take 1 tablet by mouth daily.  . NON FORMULARY Add magic cup one a day to promote weight gain.  . NON FORMULARY Give 120 ML MED PASS QID TO PROMOTE WEIGHT GAIN AND WOUND HEALING  . omeprazole (PRILOSEC) 20 MG capsule Take 1 capsule (20 mg total) by mouth daily.  . ondansetron (ZOFRAN) 4 MG tablet Take 4 mg by mouth every 8 (eight) hours as needed for nausea or vomiting.  . OXYGEN Nasal O2 2/ min for hypoxia  . phenytoin (DILANTIN) 100 MG ER capsule Take 1 capsule (100 mg total) by mouth 2 (two) times daily.  . polyethylene glycol (MIRALAX / GLYCOLAX) packet Take 17 g by mouth daily as needed for mild constipation.  . Sodium Phosphates (RA SALINE ENEMA RE) Constipation (3 of 4): If not relieved by Biscodyl suppository, give disposable Saline Enema rectally X 1 dose/24 hrs as needed (Do not use constipation standing orders for residents with renal failure/CFR less than 30. Contact MD for orders)    Constipation (4 of 4): Contact MD as needed if no results from enema (Nursing Measure)  . traMADol (ULTRAM) 50 MG tablet 50 mg. Give 1 PO 30 mins prior to wound care & every 6 hours prn x 4 doses.   No facility-administered encounter medications on file as of 08/15/2018.     Review of Systems   In general he is not complaining of any fever chills says he feels fairly well his breathing is better since the breathing treatment.  Skin does not complain of rashes itching or diaphoresis. GI does not complain of abdominal pain Head ears eyes nose mouth and throat is not complain of visual changes or sore throat or difficulty swallowing  Respiratory does have severe COPD oxygen dependent but says his breathing has improved since he  receives the nebulizers  Cardiac does not complain of chest pain or edema  GI does not complain of abdominal pain nausea vomiting diarrhea or constipation  GU no complaints of dysuria  Musculoskeletal has frailty but does not complain of joint pain currently.  Neurologic is not complaining of dizziness or headache or syncope.  And psych continue to be in good spirits despite his physical and medical challenges    There is no immunization history on file for this patient. Pertinent  Health Maintenance Due  Topic Date Due  . PNA vac Low Risk Adult (1 of 2 - PCV13) 08/28/2018 (Originally 12/06/1991)  . INFLUENZA VACCINE  11/26/2018   Fall Risk  01/26/2018  Falls in the past year? No   Functional Status Survey:   Is afebrile pulse is 105-respirations 18 blood pressure 140/77-112/60 Oxygen  saturation is in the 90s,on   chronic oxygen  Physical Exam  In general this is a frail elderly male who appears relatively at his baseline continues to be very pleasant  His skin is warm and dry he is not diaphoretic  Eyes he does have arcus senilis visual acuity appears to be intact  Oropharynx is clear he has numerous extractions mucous membranes appear fairly moist  Chest he does have shallow air entry but could not really appreciate overt congestion there is no labored breathing  Heart is slightly tachycardic just over 100-he does not have significant edema   Abdomen is soft nontender with active bowel sounds.  Musculoskeletal continues with frailty generalized and moves his extremities at baseline  Neurologic his speech is clear could not really appreciate lateralizing findings he is alert.  Psych continues to be pleasant and appropriate   Labs reviewed:  August 12, 2018.  Sodium 138 potassium 4.9 BUN 17.8 creatinine 0.39.  July 07, 2018.  WBC 4.4 hello hemoglobin 11.6 platelets 164 Recent Labs    06/14/18 1447 06/15/18 0302  06/18/18 0812  06/21/18 0956 07/07/18  07/21/18 07/22/18  NA  --  139  --  139   < > 138 138 140 141  K  --  4.3  --  4.6   < > 4.6 4.9 5.2 5.4*  CL  --  92*  --  91*  --  89* 92  --  93  CO2  --  42*  --  42*  --  42* 40  --  42  GLUCOSE  --  82  --  107*  --  96  --   --   --   BUN  --  11  --  13  --  12 15 19 17  17   CREATININE  --  0.57*   < > 0.59*  --  0.61 0.6 0.5* 0.4*  0.4*  CALCIUM  --  8.9  --  8.9  --  9.0 9.3  --  9.2  MG 1.7  --   --   --   --   --   --   --   --   PHOS 2.5  --   --   --   --   --   --   --   --    < > = values in this interval not displayed.   Recent Labs    06/14/18 0906 06/18/18 0812 06/21/18 0956  AST 25 28 25   ALT 12 14 14   ALKPHOS 88 73 66  BILITOT 0.4 0.3 0.4  PROT 7.4 7.1 6.7  ALBUMIN 3.7 3.6 3.2*   Recent Labs    06/14/18 0906 06/15/18 0302  06/18/18 0812  06/19/18 0436 06/21/18 0956 07/07/18  WBC 3.9* 2.9*   < > 4.7  --  3.6* 3.3* 4.4  NEUTROABS 2.8 1.5*  --  3.1  --   --   --   --   HGB 12.0* 10.4*   < > 12.0*   < > 10.0* 11.2* 11.6*  HCT 40.9 34.4*   < > 40.8   < > 33.2* 36.9* 35*  MCV 97.8 94.2   < > 97.6  --  93.8 92.9  --   PLT 134* 110*   < > 118*  --  110* 142* 164   < > = values in this interval not displayed.   Lab Results  Component Value Date   TSH 2.47  01/26/2018   No results found for: HGBA1C No results found for: CHOL, HDL, LDLCALC, LDLDIRECT, TRIG, CHOLHDL  Significant Diagnostic Results in last 30 days:  No results found.  Assessment/Plan  #1 failure to thrive- challenging situation with patient's comorbidities most significantly in stage COPD.  At this point continue supportive care Dr. Linna Darner has discussed this previously with his daughter- consideration of hospice however because of coronavirus hospice currently is limiting its visits to facilities At this point continue supportive care including numerous supplements as well as Remeron to help with appetite stimulation.  I did discuss feeding tube with his wife and family as previously  stated does not desire a PEG tube and patient has made that clear to them as well it appears.  2 history of COPD again appears to be entering end stages he is on chronic oxygen he does benefit from nebulizers he also has budesonide as well as Brovana.  3.  History of rectal bleed this has been discussed with Dr. Linna Darner and his daughter-emphasis is on comfort clinically he appears to be at baseline  His aspirin has been discontinued and he is on a proton pump.  [id number] note greater than 25 minutes spent assessing patient reviewing his chart and labs- discussing his status with his wife Angelita Ingles via phone and coordinating a plan of care of note greater than 50% of time spent coordinating plan of care with input as noted above

## 2018-08-19 ENCOUNTER — Non-Acute Institutional Stay (SKILLED_NURSING_FACILITY): Payer: MEDICARE | Admitting: Internal Medicine

## 2018-08-19 ENCOUNTER — Encounter: Payer: Self-pay | Admitting: Internal Medicine

## 2018-08-19 DIAGNOSIS — G40909 Epilepsy, unspecified, not intractable, without status epilepticus: Secondary | ICD-10-CM | POA: Diagnosis not present

## 2018-08-19 DIAGNOSIS — K625 Hemorrhage of anus and rectum: Secondary | ICD-10-CM

## 2018-08-19 DIAGNOSIS — R627 Adult failure to thrive: Secondary | ICD-10-CM | POA: Diagnosis not present

## 2018-08-19 DIAGNOSIS — J449 Chronic obstructive pulmonary disease, unspecified: Secondary | ICD-10-CM

## 2018-08-19 DIAGNOSIS — Z8546 Personal history of malignant neoplasm of prostate: Secondary | ICD-10-CM

## 2018-08-19 NOTE — Progress Notes (Signed)
Location:    [facility] Room Number: 107A Place of Service:  SNF [phone]) Provider:  Granville Lewis, PA   Patient Care Team: Clovia Cuff, MD as PCP - General (Internal Medicine)  Extended Emergency Contact Information Primary Emergency Contact: Titus Mould Mobile Phone: [phone] Relation: Daughter Secondary Emergency Contact: [place] Phone: [phone] Mobile Phone: [phone] Relation: Spouse  Code Status:  DNR Goals of care: Advanced Directive information Advanced Directives 08/15/2018  Does Patient Have a Medical Advance Directive? Yes  Type of Advance Directive Out of facility DNR (pink MOST or yellow form)  Does patient want to make changes to medical advance directive? No - Patient declined  Would patient like information on creating a medical advance directive? No - Patient declined     Chief Complaint  Patient presents with  . Medical Management of Chronic Issues    Routine Visit  For medical management of chronic medical conditions failure to thrive- COPD- seizure disorder- prostate cancer  HPI:  Pt is a 85 y.o. male seen today for medical management of chronic diseases.  Patient has a history of advanced end-stage COPD he is on chronic oxygen and does have failure to thrive issues.  He is on numerous supplements and actually appears to have gained about 5 or 6 pounds over the past week to 10 days he does have have respiratory issues with appears to be end-stage COPD he is on Brovana as well as budesonide as well his duo nebs every 4 hours as needed  Not complaining of shortness of breath currently nebulizers and pulmonary toilet appears to help.  He does have a history of seizure disorder this is been stable on Keppra and Dilantin.  At this point he does not have any complaints vital signs appear to be stable at times he has somewhat tachycardic this appears to be transitory.  Does not complain of chest pain or  palpitations  He recently had an episode of some mild rectal bleeding which is been intermittent-Dr. Linna Darner did speak with his daughter desires are for no aggressive intervention-emphasis on comfort-he is on a proton pump inhibitor and his aspirin was discontinued.  At this point appears to be relatively stable    Past Medical History:  Diagnosis Date  . Asthma   . Emphysema of lung ([place])   . Prostate cancer ([place])   . Seizures ([place])    Past Surgical History:  Procedure Laterality Date  . HERNIA REPAIR      No Known Allergies  Allergies as of 08/19/2018   No Known Allergies     Medication List       Accurate as of August 19, 2018 10:04 AM. Always use your most recent med list.        acetaminophen 325 MG tablet Commonly known as:  TYLENOL Take 650 mg by mouth every 6 (six) hours. For pain   bisacodyl 10 MG suppository Commonly known as:  DULCOLAX Constipation (2 of 4): If not relieved by MOM, give 10 mg Bisacodyl suppositiory rectally X 1 dose in 24 hours as needed (Do not use constipation standing orders for residents with renal failure/CFR less than 30. Contact MD for orders)   Brovana 15 MCG/2ML Nebu Generic drug:  arformoterol INHALE 1 VIAL VIA NEBULIZER ONCE DAILY FOR COPD   budesonide 0.25 MG/2ML nebulizer solution Commonly known as:  PULMICORT 0.25 mg. INHALE 1 VIAL VIA NEBULIZER ONCE DAILY FOR COPD   furosemide 20 MG tablet Commonly known as:  LASIX  Take 20 mg by mouth daily as needed for edema.   guaiFENesin 600 MG 12 hr tablet Commonly known as:  MUCINEX Take 1 tablet (600 mg total) by mouth 2 (two) times daily.   ipratropium-albuterol 0.5-2.5 (3) MG/3ML Soln Commonly known as:  DUONEB Take 3 mLs by nebulization every 4 (four) hours as needed.   levETIRAcetam 1000 MG tablet Commonly known as:  KEPPRA Take 1,000 mg by mouth daily.   magnesium hydroxide 400 MG/5ML suspension Commonly known as:  MILK OF MAGNESIA Constipation (1 of 4): If no BM in  3 days, give 30 cc Milk of Magnesium p.o. x 1 dose in 24 hours as needed (Do not use standing constipation orders for residents with renal failure CFR less than 30. Contact MD for orders)   mirtazapine 15 MG tablet Commonly known as:  REMERON Take 7.5 mg by mouth at bedtime.   multivitamin with minerals Tabs tablet Take 1 tablet by mouth daily.   NON FORMULARY Add magic cup one a day to promote weight gain.   NON FORMULARY Give 120 ML MED PASS QID TO PROMOTE WEIGHT GAIN AND WOUND HEALING   omeprazole 20 MG capsule Commonly known as:  PriLOSEC Take 1 capsule (20 mg total) by mouth daily.   ondansetron 4 MG tablet Commonly known as:  ZOFRAN Take 4 mg by mouth every 8 (eight) hours as needed for nausea or vomiting.   OXYGEN Nasal O2 2/ min for hypoxia   phenytoin 100 MG ER capsule Commonly known as:  DILANTIN Take 1 capsule (100 mg total) by mouth 2 (two) times daily.   polyethylene glycol 17 g packet Commonly known as:  MIRALAX / GLYCOLAX Take 17 g by mouth daily as needed for mild constipation.   RA SALINE ENEMA RE Constipation (3 of 4): If not relieved by Biscodyl suppository, give disposable Saline Enema rectally X 1 dose/24 hrs as needed (Do not use constipation standing orders for residents with renal failure/CFR less than 30. Contact MD for orders)    Constipation (4 of 4): Contact MD as needed if no results from enema (Nursing Measure)   traMADol 50 MG tablet Commonly known as:  ULTRAM 50 mg. Give 1 PO 30 mins prior to wound care & every 6 hours prn x 4 doses.   Vitamin D3 25 MCG (1000 UT) Caps Take 1,000 Units by mouth daily at 12 noon.       Review of Systems   General this is a pleasant elderly male he does not complain of fever chills has gained a small amount of weight.  Skin is not complaining of rashes or itching.  Head ears eyes nose mouth and throat is not complaining of sore throat or visual changes.  Respiratory appears to have end-stage COPD  does not complain of increased shortness of breath or cough beyond baseline-he is on chronic oxygen.  Cardiac does not complain of chest pain or edema.  GI is not complaining of abdominal pain nausea vomiting diarrhea or constipation thinks his appetite is all right.  GU is not complaining of dysuria.  Musculoskeletal has diffuse weakness but does not complain of pain.  Neurologic positive for weakness he does not complain of headache dizziness or syncope.  Psych continues to be in good spirits does not complain of being depressed or anxious   There is no immunization history on file for this patient. Pertinent  Health Maintenance Due  Topic Date Due  . PNA vac Low Risk Adult (1 of  2 - PCV13) 08/28/2018 (Originally 12/06/1991)  . INFLUENZA VACCINE  11/26/2018   Fall Risk  01/26/2018  Falls in the past year? No   Functional Status Survey:    Vitals:   08/19/18 0945  BP: 124/67  Pulse: 99  Resp: 16  Temp: (!) 97.3 F (36.3 C)  Weight: 91 lb 6.4 oz (41.5 kg)  Height: 5\' 6"  (1.676 m)  Update blood pressure is 130/70 oxygen saturation is 90% on chronic oxygen Body mass index is 14.75 kg/m. Physical Exam In general this is a pleasant frail-appearing elderly male in no distress  His skin is warm and dry.  Eyes visual acuity appears to be intact he has arcus senilis.  Oropharynx he has poor dentition mucous membranes appear fairly moist  Chest shallow air entry could not really appreciate overt congestion there is no labored breathing.  Heart is regular rate and rhythm occasional skipped beat- pulse is in the high 90s running from around 95-105 on exam  Abdomen is soft nontender with positive bowel sounds.  Musculoskeletal has general frailty is able to move all extremities appears at baseline with significant weakness especially lower extremities.  Neurologic as noted above could not appreciate lateralizing findings his speech is clear.  Psych he is pleasant  appropriate largely alert and oriented     labs reviewed:  August 12, 2018  Sodium 138 potassium 4.9 BUN 18 creatinine 0.39  July 07, 2018.  WBC 4.4 hemoglobin 11.6 platelets 164   Recent Labs    06/14/18 1447 06/15/18 0302  06/18/18 0812  06/21/18 0956 07/07/18 07/21/18 07/22/18  NA  --  139  --  139   < > 138 138 140 141  K  --  4.3  --  4.6   < > 4.6 4.9 5.2 5.4*  CL  --  92*  --  91*  --  89* 92  --  93  CO2  --  42*  --  42*  --  42* 40  --  42  GLUCOSE  --  82  --  107*  --  96  --   --   --   BUN  --  11  --  13  --  12 15 19 17  17   CREATININE  --  0.57*   < > 0.59*  --  0.61 0.6 0.5* 0.4*  0.4*  CALCIUM  --  8.9  --  8.9  --  9.0 9.3  --  9.2  MG 1.7  --   --   --   --   --   --   --   --   PHOS 2.5  --   --   --   --   --   --   --   --    < > = values in this interval not displayed.   Recent Labs    06/14/18 0906 06/18/18 0812 06/21/18 0956  AST 25 28 25   ALT 12 14 14   ALKPHOS 88 73 66  BILITOT 0.4 0.3 0.4  PROT 7.4 7.1 6.7  ALBUMIN 3.7 3.6 3.2*   Recent Labs    06/14/18 0906 06/15/18 0302  06/18/18 0812  06/19/18 0436 06/21/18 0956 07/07/18  WBC 3.9* 2.9*   < > 4.7  --  3.6* 3.3* 4.4  NEUTROABS 2.8 1.5*  --  3.1  --   --   --   --   HGB 12.0* 10.4*   < > 12.0*   < >  10.0* 11.2* 11.6*  HCT 40.9 34.4*   < > 40.8   < > 33.2* 36.9* 35*  MCV 97.8 94.2   < > 97.6  --  93.8 92.9  --   PLT 134* 110*   < > 118*  --  110* 142* 164   < > = values in this interval not displayed.   Lab Results  Component Value Date   TSH 2.47 01/26/2018   No results found for: HGBA1C No results found for: CHOL, HDL, LDLCALC, LDLDIRECT, TRIG, CHOLHDL  Significant Diagnostic Results in last 30 days:  No results found.  Assessment/Plan  #1 history of failure to thrive-he is actually gained a small amount of weight which is encouraging he is on numerous supplements as well as Remeron for appetite stimulation- his prognosis continues to be quite guarded-at this  point continue supportive care there was thought of getting hospice involved but because of the coronavirus issues limiting hospice visits this has not been able to be completed-.  Per Dr. Clayborn Heron discussion with his daughter emphasis is mainly on comfort.  2 history of end-stage COPD please see above he is on chronic oxygen as well as Brovana and budesonide and Mucinex he also has nebulizers as needed.  I do note he is on duo nebs with occasional tachycardia will switch this to Atrovent nebulizers  and monitor--- clinically he appears to be at baseline today.  3.  Seizure disorder this is been stable on Keppra and Dilantin -Dilatin level  Was 6.1 in late March--clinically has been stable we continue to monitor--  -4-history of prostate cancer recent PSA was 0.07 which suggest there has been no reoccurrence  #5 history of rectal bleeding apparently this is been mild and intermittent- per discussion above- emphasis is on comfort at this point appears to be stable clinically we will continue to monitor-- no longer on aspirin he is on a proton pump inhibitor  [id number]  Granville Lewis, PA

## 2018-08-25 ENCOUNTER — Non-Acute Institutional Stay (SKILLED_NURSING_FACILITY): Payer: MEDICARE | Admitting: Internal Medicine

## 2018-08-25 ENCOUNTER — Other Ambulatory Visit: Payer: Self-pay | Admitting: Internal Medicine

## 2018-08-25 ENCOUNTER — Encounter: Payer: Self-pay | Admitting: Internal Medicine

## 2018-08-25 DIAGNOSIS — R627 Adult failure to thrive: Secondary | ICD-10-CM

## 2018-08-25 DIAGNOSIS — L89150 Pressure ulcer of sacral region, unstageable: Secondary | ICD-10-CM | POA: Diagnosis not present

## 2018-08-25 DIAGNOSIS — L8990 Pressure ulcer of unspecified site, unspecified stage: Secondary | ICD-10-CM | POA: Insufficient documentation

## 2018-08-25 MED ORDER — FENTANYL 25 MCG/HR TD PT72: 1.0000 | MEDICATED_PATCH | TRANSDERMAL | 0 refills | Status: DC

## 2018-08-25 NOTE — Assessment & Plan Note (Addendum)
Severe anorexia has resulted in minimal intake and progressive weight loss.  Speech therapy will be consulted.  Diet will be liberalized. Palliative Care consult will be requested as this has not been completed as thought.

## 2018-08-25 NOTE — Patient Instructions (Signed)
See assessment and plan under each diagnosis in the problem list and acutely for this visit 

## 2018-08-25 NOTE — Assessment & Plan Note (Signed)
Wound care continues to monitor Fentanyl has been requested for pain control

## 2018-08-25 NOTE — Progress Notes (Signed)
   NURSING HOME LOCATION:  [facility] ROOM NUMBER:  107/A  CODE STATUS: DNR   PCP: Clovia Cuff MD.   This is a nursing facility follow up for specific acute issues of pain control and anorexia.  Interim medical record and care since last [place] visit was updated with review of diagnostic studies and change in clinical status since last visit were documented.  HPI: Staff reports that the patient is not eating and continuing to lose weight despite being on generic Remeron. SNF Wound Care Nurse states that his decubitus is progressing and is causing pain for which tramadol has been ordered.  Palliative Care was thought to have been consulted, but apparently not completed to date.  He remains on skilled care but refuses PT/OT because of discomfort. Paraphimosis has been treated with topical interventions; by history it is improving.  Review of systems: He speaks in a whisper and content is almost indiscernible.  He did seem to indicate that he was not having pain or other active complaints beyond "mouth dry".  As noted history is essentially totally from the staff caregivers. Constitutional: No fever Cardiovascular: No chest pain, palpitations, paroxysmal nocturnal dyspnea  Respiratory: No cough, sputum production, hemoptysis Gastrointestinal: No heartburn, dysphagia, abdominal pain, nausea /vomiting, rectal bleeding, melena, change in bowels Genitourinary: No dysuria, hematuria, pyuria, incontinence, nocturia Dermatologic: No new rash, pruritus, change in appearance of skin Hematologic/lymphatic: No significant bruising, lymphadenopathy, abnormal bleeding  Physical exam:  Pertinent or positive findings: He is grossly cachectic with profound muscle wasting of the head, trunk, and limbs.  He exhibits essentially no voluntary muscular activity.  He repeatedly falls asleep.  His food tray with pured items is untouched.  Hair is disheveled; he is Geneticist, molecular.  Mustache is  present.  He has multiple missing teeth. Intraoral tissues are indeed dry.   Breath sounds are decreased.  Slight tachycardia is present.  The subclavian artery pulsation is visible on the right beneath the clavicle.Abdomen is scaphoid.  There is vitiligo around the base of the penis.  The previously noted paraphimosis has essentially resolved.  Pedal pulses are decreased.  General appearance: no acute distress or increased work of breathing is present.   Lymphatic: No lymphadenopathy about the head, neck Eyes: No conjunctival inflammation or lid edema is present. There is no scleral icterus. Ears:  External ear exam shows no significant lesions or deformities.   Nose:  External nasal examination shows no deformity or inflammation. Oral exam:  There is no oropharyngeal erythema or exudate. Neck:  No thyromegaly, masses, tenderness noted.    Heart:  No gallop, murmur, click, rub .  Lungs:  without wheezes, rhonchi, rales, rubs. Abdomen:  Abdomen is soft and nontender with no organomegaly, hernias, masses. Extremities:  No cyanosis,  edema  Neurologic exam : Balance, Rhomberg, finger to nose testing could not be completed due to clinical state Skin: Warm & dry w/o tenting. No significant rash.  See summary under each active problem in the Problem List with associated updated therapeutic plan

## 2018-08-26 ENCOUNTER — Encounter: Payer: Self-pay | Admitting: Internal Medicine

## 2018-08-26 ENCOUNTER — Non-Acute Institutional Stay (SKILLED_NURSING_FACILITY): Payer: MEDICARE | Admitting: Internal Medicine

## 2018-08-26 ENCOUNTER — Non-Acute Institutional Stay: Payer: MEDICARE | Admitting: Primary Care

## 2018-08-26 ENCOUNTER — Other Ambulatory Visit: Payer: Self-pay

## 2018-08-26 ENCOUNTER — Telehealth: Payer: Self-pay

## 2018-08-26 DIAGNOSIS — R52 Pain, unspecified: Secondary | ICD-10-CM | POA: Diagnosis not present

## 2018-08-26 DIAGNOSIS — R627 Adult failure to thrive: Secondary | ICD-10-CM | POA: Diagnosis not present

## 2018-08-26 DIAGNOSIS — J449 Chronic obstructive pulmonary disease, unspecified: Secondary | ICD-10-CM | POA: Diagnosis not present

## 2018-08-26 DIAGNOSIS — Z515 Encounter for palliative care: Secondary | ICD-10-CM

## 2018-08-26 NOTE — Progress Notes (Signed)
  [facility] Palliative [facility] Consult Note Telephone: [phone]  Fax: [phone]   TELEHEALTH VISIT STATEMENT Due to the COVID-19 crisis, this visit was done via telemedicine from my office. It was initiated and consented to by this patient and/or family.   PATIENT NAME: Brent [name] DOB: 03/14/1927 MRN: [id number]  PRIMARY CARE PROVIDER:  Hopper, Brent F, MD  REFERRING PROVIDER:  Hopper, Brent F, MD [address] [place] [address]   RESPONSIBLE PARTY:   Extended Emergency Contact Information Primary Emergency Contact: [name], Brent Mobile Phone: [phone] Relation: Daughter Secondary Emergency Contact: [name],Brent Home Phone: [phone] Mobile Phone: [phone] Relation: Spouse  Palliative Care was asked to follow patient by consultation request of Dr. Hopper, Brent F, MD. This is the initial  visit.  ASSESSMENT and RECOMMENDATIONS:   1. Pain Management:  Recent addition of fentanyl 25 mcg patch. Effect should take 12 - 24 hours. I would Recommend morphine sulfate 2.5  mg q 4 hrs prn in addition, and to monitor for pain relief.  If he tolerates this and still needs pain control, may advance to 5 mg q 4 hrs prn  titrating up slowly. If he uses a great deal more, you could adjust the fentanyl dose.  Patient is very Painful from wounds, immobility. Morphine will also help with symptoms of dyspnea, see below.   2. Dyspnea: Recommend around the clock duonebs 1 vial every 6 hours for COPD and d/c LABA and prn SABA inhaler. Patient was short of breath with limited speaking.  Likely does not have lung capacity and strength to use the metered inhalers any more, so I'd suggest nebulized Rx. He would benefit from scheduled treatments over the day to improve dyspnea vs. Taking PRN.  3. Nutrition: Supplements and  assistance with feeding would help, as he tires very easily.Taking magic cup, fair intake. He is losing weight  but gaining is likely not a possible goals at this time.   4. Goals of Care: Patient has DNR at facility, t/c to family to step daughter per his request. No answer, message left. Patient would be medically appropriate  For hospice but I would need to discuss with family before referral.   Palliative care will continue to follow for goals of care clarification and symptom management. Return 1 weeks or prn.  I spent 25 minutes providing this consultation,  from 1030 to 1055. More than 50% of the time in this consultation was spent coordinating communication.   HISTORY OF PRESENT ILLNESS:  Brent [name] is a 85 y.o. year old male with multiple medical problems including COPD, asthma, decubitus wound,protein calorie malnutrition. Palliative Care was asked to help address goals of care.   CODE STATUS: DNR  PPS: 30% (weak)  HOSPICE ELIGIBILITY/DIAGNOSIS: yes/severe protein calorie malnutrition,  Life expectancy is less than 6 months  PAST MEDICAL HISTORY:  Past Medical History:  Diagnosis Date  . Asthma   . Emphysema of lung (HCC)   . Prostate cancer (HCC)   . Seizures (HCC)     SOCIAL HX:  Social History   Tobacco Use  . Smoking status: Former Smoker  . Smokeless tobacco: Never Used  Substance Use Topics  . Alcohol use: Not Currently    ALLERGIES: No Known Allergies   PERTINENT MEDICATIONS:  Outpatient Encounter Medications as of 08/26/2018  Medication Sig  . acetaminophen (TYLENOL) 325 MG tablet Take 650 mg by mouth every 6 (six) hours. For pain  . arformoterol (BROVANA) 15 MCG/2ML NEBU INHALE   1 VIAL VIA NEBULIZER ONCE DAILY FOR COPD  . bisacodyl (DULCOLAX) 10 MG suppository Constipation (2 of 4): If not relieved by MOM, give 10 mg Bisacodyl suppositiory rectally X 1 dose in 24 hours as needed (Do not use constipation standing orders for residents with renal failure/CFR less than 30. Contact MD for orders)  . Cholecalciferol (VITAMIN D3) 1000 units CAPS Take 1,000 Units by mouth  daily at 12 noon.   . fentaNYL (DURAGESIC) 25 MCG/HR Place 1 patch onto the skin every 3 (three) days.  . furosemide (LASIX) 20 MG tablet Take 20 mg by mouth daily as needed for edema.  . guaiFENesin (MUCINEX) 600 MG 12 hr tablet Take 1 tablet (600 mg total) by mouth 2 (two) times daily.  . ipratropium (ATROVENT HFA) 17 MCG/ACT inhaler 2 puffs. INHALER PRN Q 4HOURS  . levETIRAcetam (KEPPRA) 1000 MG tablet Take 1,000 mg by mouth daily.  . magnesium hydroxide (MILK OF MAGNESIA) 400 MG/5ML suspension Constipation (1 of 4): If no BM in 3 days, give 30 cc Milk of Magnesium p.o. x 1 dose in 24 hours as needed (Do not use standing constipation orders for residents with renal failure CFR less than 30. Contact MD for orders)  . mirtazapine (REMERON) 15 MG tablet Take 7.5 mg by mouth at bedtime.   . Multiple Vitamin (MULTIVITAMIN WITH MINERALS) TABS tablet Take 1 tablet by mouth daily.  . NON FORMULARY Add magic cup one a day to promote weight gain.  . NON FORMULARY Give 120 ML MED PASS QID TO PROMOTE WEIGHT GAIN AND WOUND HEALING  . omeprazole (PRILOSEC) 20 MG capsule Take 1 capsule (20 mg total) by mouth daily.  . ondansetron (ZOFRAN) 4 MG tablet Take 4 mg by mouth every 8 (eight) hours as needed for nausea or vomiting.  . OXYGEN Nasal O2 2/ min for hypoxia  . phenytoin (DILANTIN) 100 MG ER capsule Take 1 capsule (100 mg total) by mouth 2 (two) times daily.  . polyethylene glycol (MIRALAX / GLYCOLAX) packet Take 17 g by mouth daily as needed for mild constipation.  . Sodium Phosphates (RA SALINE ENEMA RE) Constipation (3 of 4): If not relieved by Biscodyl suppository, give disposable Saline Enema rectally X 1 dose/24 hrs as needed (Do not use constipation standing orders for residents with renal failure/CFR less than 30. Contact MD for orders)    Constipation (4 of 4): Contact MD as needed if no results from enema (Nursing Measure)  . traMADol (ULTRAM) 50 MG tablet 50 mg. Give 1 PO 30 mins prior to wound  care & every 6 hours prn x 4 doses.   No facility-administered encounter medications on file as of 08/26/2018.     PHYSICAL EXAM/ROS with patient and nurse Brent [name]   Wt: late feb. 98 lb, current weight  92.8 late April 2020 General: NAD, frail appearing, thin, moderate to severe pain in sacrum and on movement Pulmonary:SOB, DOE, Oxygen 3 L/ [place], nebs , cough occ. Abdomen:  Appetite fair, intake, endorses constipation, magic cups taken GU: incontinent Extremities: no edema, no joint deformities, D/c from PT recently, goals met Skin: no rashes, wound on sacrum unstageable, 2 x 1 cm , painful Neurological: Weakness, debility, non ambulatory   M  DNP, AGPCNP-BC    

## 2018-08-26 NOTE — Telephone Encounter (Signed)
Telehealth Visit scheduled with Palliative NP for today @ 10:30am

## 2018-08-26 NOTE — Progress Notes (Signed)
Location:    [facility] Room Number: 107/A Place of Service:  SNF [phone]) Provider:  Kristopher Glee, MD  Patient Care Team: Clovia Cuff, MD as PCP - General (Internal Medicine)  Extended Emergency Contact Information Primary Emergency Contact: Titus Mould Mobile Phone: [phone] Relation: Daughter Secondary Emergency Contact: [place] Phone: [phone] Mobile Phone: [phone] Relation: Spouse  Code Status:  DNR Goals of care: Advanced Directive information Advanced Directives 08/26/2018  Does Patient Have a Medical Advance Directive? Yes  Type of Advance Directive Out of facility DNR (pink MOST or yellow form)  Does patient want to make changes to medical advance directive? No - Patient declined  Would patient like information on creating a medical advance directive? No - Patient declined     Chief Complaint  Patient presents with  . Acute Visit    F/U Pain Management    HPI:  Pt is a 85 y.o. male seen today for an acute visit for follow-up of pain management.  Patient continues not to eat well and loses weight with a history of failure to thrive he is on Remeron.  He is on essentially comfort care measures with emphasis on comfort 10 actually did have a tele-visit with palliative care nurse practitioner this morning with recommendation to add Roxanol for pain management.  He was started on a Duragesic patch 25 mcg yesterday by Dr. Linna Darner when Dr. Linna Darner assessed him  His decubitus ulcer sacrum apparently is progressing and causing pain.  He is complaining of pain this afternoon describes this is more of his back sacral area.     Past Medical History:  Diagnosis Date  . Asthma   . Emphysema of lung ([facility])   . Prostate cancer ([facility])   . Seizures ([facility])    Past Surgical History:  Procedure Laterality Date  . HERNIA REPAIR      No Known Allergies  Outpatient Encounter  Medications as of 08/26/2018  Medication Sig  . acetaminophen (TYLENOL) 325 MG tablet Take 650 mg by mouth every 6 (six) hours. For pain  . arformoterol (BROVANA) 15 MCG/2ML NEBU INHALE 1 VIAL VIA NEBULIZER ONCE DAILY FOR COPD  . bisacodyl (DULCOLAX) 10 MG suppository Constipation (2 of 4): If not relieved by MOM, give 10 mg Bisacodyl suppositiory rectally X 1 dose in 24 hours as needed (Do not use constipation standing orders for residents with renal failure/CFR less than 30. Contact MD for orders)  . Cholecalciferol (VITAMIN D3) 1000 units CAPS Take 1,000 Units by mouth daily at 12 noon.   . fentaNYL (DURAGESIC) 25 MCG/HR Place 1 patch onto the skin every 3 (three) days.  . furosemide (LASIX) 20 MG tablet Take 20 mg by mouth daily as needed for edema.  Marland Kitchen guaiFENesin (MUCINEX) 600 MG 12 hr tablet Take 1 tablet (600 mg total) by mouth 2 (two) times daily.  Marland Kitchen ipratropium (ATROVENT HFA) 17 MCG/ACT inhaler 2 puffs. INHALER PRN Q 4HOURS  . levETIRAcetam (KEPPRA) 1000 MG tablet Take 1,000 mg by mouth daily.  . magnesium hydroxide (MILK OF MAGNESIA) 400 MG/5ML suspension Constipation (1 of 4): If no BM in 3 days, give 30 cc Milk of Magnesium p.o. x 1 dose in 24 hours as needed (Do not use standing constipation orders for residents with renal failure CFR less than 30. Contact MD for orders)  . mirtazapine (REMERON) 15 MG tablet Take 7.5 mg by mouth at bedtime.   . Multiple Vitamin (MULTIVITAMIN  WITH MINERALS) TABS tablet Take 1 tablet by mouth daily.  . NON FORMULARY Add magic cup one a day to promote weight gain.  . NON FORMULARY Give 120 ML MED PASS QID TO PROMOTE WEIGHT GAIN AND WOUND HEALING  . omeprazole (PRILOSEC) 20 MG capsule Take 1 capsule (20 mg total) by mouth daily.  . ondansetron (ZOFRAN) 4 MG tablet Take 4 mg by mouth every 8 (eight) hours as needed for nausea or vomiting.  . OXYGEN Nasal O2 2/ min for hypoxia  . phenytoin (DILANTIN) 100 MG ER capsule Take 1 capsule (100 mg total) by mouth 2  (two) times daily.  . polyethylene glycol (MIRALAX / GLYCOLAX) packet Take 17 g by mouth daily as needed for mild constipation.  . Sodium Phosphates (RA SALINE ENEMA RE) Constipation (3 of 4): If not relieved by Biscodyl suppository, give disposable Saline Enema rectally X 1 dose/24 hrs as needed (Do not use constipation standing orders for residents with renal failure/CFR less than 30. Contact MD for orders)    Constipation (4 of 4): Contact MD as needed if no results from enema (Nursing Measure)  . traMADol (ULTRAM) 50 MG tablet 50 mg. Give 1 PO 30 mins prior to wound care & every 6 hours prn x 4 doses.   No facility-administered encounter medications on file as of 08/26/2018.     Review of Systems This is limited secondary to softly hard to discern.speech  He is saying he is having some pain in his back area his says his breathing is not that great but no acute complaints of shortness of breath or discomfort in this regards Does not complain of any chest pain nausea or vomiting or abdominal pain.  His pureed lunch is still sitting on the tray   Physical exam.  Care is 98.0 pulse is 110 on auscultation aspirations of 18 blood pressure 120/67 oxygen saturation is 97% on chronic oxygen.  In general this is a very frail elderly male he does not appear to be in any distress but is quite weak.  Skin is warm and dry  de cubitus ulcer could not be assessed secondary to patient positioning per discussion with wound care nurse this is progressing  Oropharynx mucous membranes appear to be somewhat dry.  Chest very shallow air entry there is no labored breathing cannot really appreciate overt congestion.  Heart is regular rhythm with tachycardia appears to vary from mid 90's  up to at times as high as 120 He does not have significant edema  Abdomen is soft nontender with active bowel sounds  Musculoskeletal has diffuse muscle atrophy with significant generalized weakness  Neurologic he is  alert he is talking but speaks in a very soft voice continues to have a very pleasant disposition  Psych as noted above.     There is no immunization history on file for this patient. Pertinent  Health Maintenance Due  Topic Date Due  . PNA vac Low Risk Adult (1 of 2 - PCV13) 08/28/2018 (Originally 12/06/1991)  . INFLUENZA VACCINE  11/26/2018   Fall Risk  01/26/2018  Falls in the past year? No   Functional Status Survey:      Physical Exam As noted above   Labs reviewed: Recent Labs    06/14/18 1447 06/15/18 0302  06/18/18 0812  06/21/18 0956 07/07/18 07/21/18 07/22/18  NA  --  139  --  139   < > 138 138 140 141  K  --  4.3  --  4.6   < > 4.6 4.9 5.2 5.4*  CL  --  92*  --  91*  --  89* 92  --  93  CO2  --  42*  --  42*  --  42* 40  --  42  GLUCOSE  --  82  --  107*  --  96  --   --   --   BUN  --  11  --  13  --  12 15 19 17  17   CREATININE  --  0.57*   < > 0.59*  --  0.61 0.6 0.5* 0.4*  0.4*  CALCIUM  --  8.9  --  8.9  --  9.0 9.3  --  9.2  MG 1.7  --   --   --   --   --   --   --   --   PHOS 2.5  --   --   --   --   --   --   --   --    < > = values in this interval not displayed.   Recent Labs    06/14/18 0906 06/18/18 0812 06/21/18 0956  AST 25 28 25   ALT 12 14 14   ALKPHOS 88 73 66  BILITOT 0.4 0.3 0.4  PROT 7.4 7.1 6.7  ALBUMIN 3.7 3.6 3.2*   Recent Labs    06/14/18 0906 06/15/18 0302  06/18/18 0812  06/19/18 0436 06/21/18 0956 07/07/18  WBC 3.9* 2.9*   < > 4.7  --  3.6* 3.3* 4.4  NEUTROABS 2.8 1.5*  --  3.1  --   --   --   --   HGB 12.0* 10.4*   < > 12.0*   < > 10.0* 11.2* 11.6*  HCT 40.9 34.4*   < > 40.8   < > 33.2* 36.9* 35*  MCV 97.8 94.2   < > 97.6  --  93.8 92.9  --   PLT 134* 110*   < > 118*  --  110* 142* 164   < > = values in this interval not displayed.   Lab Results  Component Value Date   TSH 2.47 01/26/2018   No results found for: HGBA1C No results found for: CHOL, HDL, LDLCALC, LDLDIRECT, TRIG, CHOLHDL  Significant  Diagnostic Results in last 30 days:  No results found.  Assessment/Plan  Pain management with history of failure to thrive and progressing decubitus ulcer.  As noted above he is now on Duragesic patch ordered yesterday  - tele-visit with palliative care nurse practitioner with recommendation to possibly start Roxanol   I did discuss this with Dr. Linna Darner via phone sincepatient has just literally been started on Duragesic patch would like to give this about 3 days to monitor effectiveness- if not effective may discontinue and start Roxanol  At this point will monitor he does not appear to be in any distress but will need some time for the Duragesic patch to take effect.   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 Linna Darner  And coordinating and formulating a plan of care  Of note greater than 50% of time spent coordinating plan of care with input as noted above

## 2018-08-29 ENCOUNTER — Non-Acute Institutional Stay (SKILLED_NURSING_FACILITY): Payer: MEDICARE | Admitting: Internal Medicine

## 2018-08-29 ENCOUNTER — Encounter: Payer: Self-pay | Admitting: Internal Medicine

## 2018-08-29 DIAGNOSIS — J449 Chronic obstructive pulmonary disease, unspecified: Secondary | ICD-10-CM | POA: Diagnosis not present

## 2018-08-29 DIAGNOSIS — R627 Adult failure to thrive: Secondary | ICD-10-CM

## 2018-08-29 NOTE — Progress Notes (Signed)
This is an acute visit.  Level of care skilled.  Facility is [facility].  Chief complaint acute visit follow-up pain management with failure to thrive.  History of present illness.  Patient is a very pleasant 85 year old Brent [name] seen today for follow-up of pain management.  Patient is under essentially comfort care measures with failure to thrive.  There was a tele-visit last Friday with palliative care nurse practitioner and recommendation was to add Roxanol for pain management.  However previous day he had been started on Duragesic patch by Dr. Linna Darner and it was thought to give a few days to see how this would work before adding the [place].  On assessment today at this point he appears to be resting comfortably--.  However apparently at times he does complain of pain in his back-he does have a history of a sacral ulcer which apparently is progressing.  He does appear to be very weak he is alert and responsive--- there have been concerns adding Roxanol may be a bit over sedating with his Duragesic patch.  Vital signs appear to be stable at times he is tachycardic I got a pulse of around 100 on exam this afternoon.  There was also recommendation by palliative care to DC his inhalers and go with a routine nebulizer instead we have written orders to that effect  Past Medical History:  Diagnosis Date  . Asthma   . Emphysema of lung ([place])   . Prostate cancer ([place])   . Seizures ([place])         Past Surgical History:  Procedure Laterality Date  . HERNIA REPAIR      No Known Allergies    MEDICATIONS     Medication Sig  . acetaminophen (TYLENOL) 325 MG tablet Take 650 mg by mouth every 6 (six) hours. For pain  . arformoterol (BROVANA) 15 MCG/2ML NEBU INHALE 1 VIAL VIA NEBULIZER ONCE DAILY FOR COPD  . bisacodyl (DULCOLAX) 10 MG suppository Constipation (2 of 4): If not relieved by MOM, give 10 mg Bisacodyl suppositiory rectally X 1 dose in 24 hours as needed (Do not  use constipation standing orders for residents with renal failure/CFR less than 30. Contact MD for orders)  . Cholecalciferol (VITAMIN D3) 1000 units CAPS Take 1,000 Units by mouth daily at 12 noon.   . fentaNYL (DURAGESIC) 25 MCG/HR Place 1 patch onto the skin every 3 (three) days.  . furosemide (LASIX) 20 MG tablet Take 20 mg by mouth daily as needed for edema.  Marland Kitchen guaiFENesin (MUCINEX) 600 MG 12 hr tablet Take 1 tablet (600 mg total) by mouth 2 (two) times daily.  Marland Kitchen ipratropium (ATROVENT HFA) 17 MCG/ACT inhaler 2 puffs. INHALER PRN Q 4HOURS  . levETIRAcetam (KEPPRA) 1000 MG tablet Take 1,000 mg by mouth daily.  . magnesium hydroxide (MILK OF MAGNESIA) 400 MG/5ML suspension Constipation (1 of 4): If no BM in 3 days, give 30 cc Milk of Magnesium p.o. x 1 dose in 24 hours as needed (Do not use standing constipation orders for residents with renal failure CFR less than 30. Contact MD for orders)  . mirtazapine (REMERON) 15 MG tablet Take 7.5 mg by mouth at bedtime.   . Multiple Vitamin (MULTIVITAMIN WITH MINERALS) TABS tablet Take 1 tablet by mouth daily.  . NON FORMULARY Add magic cup one a day to promote weight gain.  . NON FORMULARY Give 120 ML MED PASS QID TO PROMOTE WEIGHT GAIN AND WOUND HEALING  . omeprazole (PRILOSEC) 20 MG capsule Take 1  capsule (20 mg total) by mouth daily.  . ondansetron (ZOFRAN) 4 MG tablet Take 4 mg by mouth every 8 (eight) hours as needed for nausea or vomiting.  . OXYGEN Nasal O2 2/ min for hypoxia  . phenytoin (DILANTIN) 100 MG ER capsule Take 1 capsule (100 mg total) by mouth 2 (two) times daily.  . polyethylene glycol (MIRALAX / GLYCOLAX) packet Take 17 g by mouth daily as needed for mild constipation.  . Sodium Phosphates (RA SALINE ENEMA RE) Constipation (3 of 4): If not relieved by Biscodyl suppository, give disposable Saline Enema rectally X 1 dose/24 hrs as needed (Do not use constipation standing orders for residents with renal failure/CFR less than 30. Contact  MD for orders)    Constipation (4 of 4): Contact MD as needed if no results from enema (Nursing Measure)  . traMADol (ULTRAM) 50 MG tablet 50 mg. Give 1 PO 30 mins prior to wound care & every 6 hours prn x 4 doses.   No facility-administered encounter medications on file as of 08/26/2018.    Review of systems is quite limited secondary to patient speaking very softly and not speaking a whole lot.  When asked he says he is not currently having any pain he does not express discomfort.  Apparently at times during the day he will at times complain of discomfort.  He continues to have significant weakness and this appears to be progressing   Temperature is 97.7 pulse is 100 respirations 16 blood pressure 120/67 oxygen saturations have been in the 90s.  General this is a very frail elderly Brent [name] in no distress he appeared to be sleeping when I entered the room but when I engaged him in conversation he did reply and reply appropriately although speaks very softly.  His skin is warm and dry. Have a history of a decubitus ulcer sacrum which was not assessed because of his positioning this afternoon.  Per previous discussion of wound care nurse this is progressing.  Oropharynx mucous membranes appear to be somewhat dry.  Chest has very shallow air entry but could not appreciate labored breathing or any sign of distress.  Heart is regular rate and rhythm borderline tachycardic at 100 on exam he does not have edema.  Abdomen has somewhat of a cathartic appearance it is soft nontender with positive bowel sounds.  Musculoskeletal continues with diffuse muscle wasting atrophy and generalized weakness this appears to be baseline does move his extremities at baseline but is not moving much.  Neurologic he is alert talking but speaks very softly and is not talking a whole lot.  Psych as noted above he appears grossly alert oriented and appropriate  Labs.  July 22, 2018.  Sodium 141  potassium 5.4 BUN 17 and 0.4  July 07, 2018.  WBC 4.4 hemoglobin 11.6 platelets 164.   Assessment and plan.  Failure to thrive-again he continues under essentially comfort care.  He is denying pain currently but this will have to be watched he has just been started on a Duragesic patch as of late last week.  I discuss his pain management with his nurse who is quite familiar with him-at this point per discussion with her  somewhat hesitant to add Roxanol secondary to sedation concerns but this will  need to be watched  This will need reevaluation  Most likely tomorrow- again the goal is comfort and trying to keep him comfortable without oversedation.  In regards to COPD this is end-stage he is on Atrovent nebulizers  routinely  will DC the inhaler since most likely is not receiving much benefit from this since is limited ability to inhale deeply enough for effectiveness-as noted previously by palliative nurse practitioner.  [id number]

## 2018-08-30 ENCOUNTER — Encounter: Payer: Self-pay | Admitting: Adult Health

## 2018-08-30 ENCOUNTER — Non-Acute Institutional Stay (SKILLED_NURSING_FACILITY): Payer: MEDICARE | Admitting: Adult Health

## 2018-08-30 DIAGNOSIS — G8929 Other chronic pain: Secondary | ICD-10-CM | POA: Diagnosis not present

## 2018-08-30 DIAGNOSIS — L89150 Pressure ulcer of sacral region, unstageable: Secondary | ICD-10-CM | POA: Diagnosis not present

## 2018-08-30 DIAGNOSIS — J449 Chronic obstructive pulmonary disease, unspecified: Secondary | ICD-10-CM | POA: Diagnosis not present

## 2018-08-30 DIAGNOSIS — R630 Anorexia: Secondary | ICD-10-CM | POA: Diagnosis not present

## 2018-08-30 DIAGNOSIS — G40909 Epilepsy, unspecified, not intractable, without status epilepticus: Secondary | ICD-10-CM

## 2018-08-30 NOTE — Progress Notes (Signed)
Location:  [facility] Room Number: 107/A Place of Service:  SNF (31) Provider:  Durenda Age, DNP, FNP-BC  Patient Care Team: Hendricks Limes, MD as PCP - General (Internal Medicine) Berneta Sages, NP as Nurse Practitioner (Adult Health Nurse Practitioner)  Extended Emergency Contact Information Primary Emergency Contact: Titus Mould Mobile Phone: [phone] Relation: Daughter Secondary Emergency Contact: [place] Phone: [phone] Mobile Phone: [phone] Relation: Spouse  Code Status: DNR  Goals of care: Advanced Directive information Advanced Directives 09/01/2018  Does Patient Have a Medical Advance Directive? Yes  Type of Advance Directive Out of facility DNR (pink MOST or yellow form)  Does patient want to make changes to medical advance directive? No - Patient declined  Would patient like information on creating a medical advance directive? No - Patient declined     Chief Complaint  Patient presents with  . Acute Visit    F/U Pain Management    HPI:  Pt is a 85 y.o. male seen today for medical management of chronic diseases.  He has PMH of asthma, cancer, emphysema and seizures.  He was seen in the room today.  He is cachectic and was reported to have poor appetite.  He uses O2 at 4 L/minute via [place] continuously.  He is currently followed up by palliative care.  He said that his pain level on his bottom was 10/10. He has sacral wound with adherent yellow slough, unstageable.   Past Medical History:  Diagnosis Date  . Asthma   . Emphysema of lung ([place])   . Prostate cancer ([place])   . Seizures ([place])    Past Surgical History:  Procedure Laterality Date  . HERNIA REPAIR      No Known Allergies  Outpatient Encounter Medications as of 08/30/2018  Medication Sig  . acetaminophen (TYLENOL) 325 MG tablet Take 650 mg by mouth every 6 (six) hours. For pain  . bisacodyl (DULCOLAX) 10 MG suppository Constipation (2  of 4): If not relieved by MOM, give 10 mg Bisacodyl suppositiory rectally X 1 dose in 24 hours as needed (Do not use constipation standing orders for residents with renal failure/CFR less than 30. Contact MD for orders)  . Cholecalciferol (VITAMIN D3) 1000 units CAPS Take 1,000 Units by mouth daily at 12 noon.   . furosemide (LASIX) 20 MG tablet Take 20 mg by mouth daily as needed for edema.  Marland Kitchen guaiFENesin (MUCINEX) 600 MG 12 hr tablet Take 1 tablet (600 mg total) by mouth 2 (two) times daily.  Marland Kitchen ipratropium (ATROVENT) 0.02 % nebulizer solution Take 0.5 mg by nebulization every 6 (six) hours.  . levETIRAcetam (KEPPRA) 1000 MG tablet Take 1,000 mg by mouth daily.  . magnesium hydroxide (MILK OF MAGNESIA) 400 MG/5ML suspension Constipation (1 of 4): If no BM in 3 days, give 30 cc Milk of Magnesium p.o. x 1 dose in 24 hours as needed (Do not use standing constipation orders for residents with renal failure CFR less than 30. Contact MD for orders)  . mirtazapine (REMERON) 15 MG tablet Take 7.5 mg by mouth at bedtime.   . Multiple Vitamin (MULTIVITAMIN WITH MINERALS) TABS tablet Take 1 tablet by mouth daily.  . NON FORMULARY Magic Cup bid to promote weight gain  . NON FORMULARY Give 120 ML MED PASS QID TO PROMOTE WEIGHT GAIN AND WOUND HEALING  . omeprazole (PRILOSEC) 20 MG capsule Take 1 capsule (20 mg total) by mouth daily.  . ondansetron (ZOFRAN) 4 MG tablet Take 4  mg by mouth every 8 (eight) hours as needed for nausea or vomiting.  . OXYGEN Nasal O2 2/ min for hypoxia  . phenytoin (DILANTIN) 100 MG ER capsule Take 1 capsule (100 mg total) by mouth 2 (two) times daily.  . polyethylene glycol (MIRALAX / GLYCOLAX) packet Take 17 g by mouth daily as needed for mild constipation.  . Sodium Phosphates (RA SALINE ENEMA RE) Constipation (3 of 4): If not relieved by Biscodyl suppository, give disposable Saline Enema rectally X 1 dose/24 hrs as needed (Do not use constipation standing orders for residents with  renal failure/CFR less than 30. Contact MD for orders)    Constipation (4 of 4): Contact MD as needed if no results from enema (Nursing Measure)  . traMADol (ULTRAM) 50 MG tablet 50 mg. Give 1 PO 30 mins prior to wound care & every 6 hours prn x 4 doses.  . [DISCONTINUED] fentaNYL (DURAGESIC) 25 MCG/HR Place 1 patch onto the skin every 3 (three) days.  . [DISCONTINUED] arformoterol (BROVANA) 15 MCG/2ML NEBU INHALE 1 VIAL VIA NEBULIZER ONCE DAILY FOR COPD  . [DISCONTINUED] ipratropium (ATROVENT HFA) 17 MCG/ACT inhaler 2 puffs. INHALER PRN Q 4HOURS  . [DISCONTINUED] morphine (ROXANOL) 20 MG/ML concentrated solution Take 5 mg by mouth every 4 (four) hours as needed for severe pain. For pain   No facility-administered encounter medications on file as of 08/30/2018.     Review of Systems  GENERAL:+poor appetite SKIN: sacral ulcer MOUTH and THROAT: Denies oral discomfort, gingival pain or bleeding RESPIRATORY: no cough, SOB, DOE, wheezing, hemoptysis CARDIAC: No chest pain, edema or palpitations GI: No abdominal pain, diarrhea, constipation, heart burn, nausea or vomiting NEUROLOGICAL: Denies dizziness, syncope, numbness, or headache PSYCHIATRIC: Denies feelings of depression or anxiety. No report of hallucinations, insomnia, paranoia, or agitation    There is no immunization history on file for this patient. Pertinent  Health Maintenance Due  Topic Date Due  . PNA vac Low Risk Adult (1 of 2 - PCV13) 09/30/2018 (Originally 12/06/1991)  . INFLUENZA VACCINE  11/26/2018   Fall Risk  01/26/2018  Falls in the past year? No     Vitals:   08/30/18 1644  BP: (!) 103/56  Pulse: (!) 125  Resp: (!) 22  Temp: 98.5 F (36.9 C)  SpO2: 96%  Weight: 90 lb 12.8 oz (41.2 kg)  Height: 5\' 6"  (1.676 m)   Body mass index is 14.66 kg/m.  Physical Exam  GENERAL APPEARANCE: Cachectic.  SKIN:  Has unstageable sacral ulcer with dressing MOUTH and THROAT: Lips are without lesions. Oral mucosa is  moist and without lesions.  RESPIRATORY: Breathing is even & unlabored, decreased breathsounds CARDIAC: RRR, no murmur,no extra heart sounds, no edema GI: Abdomen soft, normal BS, no masses, no tenderness NEUROLOGICAL: There is no tremor. Speech is clear. Alert to self, disoriented to time and place PSYCHIATRIC:  Affect and behavior are appropriate  Labs reviewed: Recent Labs    06/14/18 1447 06/15/18 0302  06/18/18 0812  06/21/18 0956 07/07/18 07/21/18 07/22/18  NA  --  139  --  139   < > 138 138 140 141  K  --  4.3  --  4.6   < > 4.6 4.9 5.2 5.4*  CL  --  92*  --  91*  --  89* 92  --  93  CO2  --  42*  --  42*  --  42* 40  --  42  GLUCOSE  --  82  --  107*  --  96  --   --   --   BUN  --  11  --  13  --  12 15 19 17  17   CREATININE  --  0.57*   < > 0.59*  --  0.61 0.6 0.5* 0.4*  0.4*  CALCIUM  --  8.9  --  8.9  --  9.0 9.3  --  9.2  MG 1.7  --   --   --   --   --   --   --   --   PHOS 2.5  --   --   --   --   --   --   --   --    < > = values in this interval not displayed.   Recent Labs    06/14/18 0906 06/18/18 0812 06/21/18 0956  AST 25 28 25   ALT 12 14 14   ALKPHOS 88 73 66  BILITOT 0.4 0.3 0.4  PROT 7.4 7.1 6.7  ALBUMIN 3.7 3.6 3.2*   Recent Labs    06/14/18 0906 06/15/18 0302  06/18/18 0812  06/19/18 0436 06/21/18 0956 07/07/18  WBC 3.9* 2.9*   < > 4.7  --  3.6* 3.3* 4.4  NEUTROABS 2.8 1.5*  --  3.1  --   --   --   --   HGB 12.0* 10.4*   < > 12.0*   < > 10.0* 11.2* 11.6*  HCT 40.9 34.4*   < > 40.8   < > 33.2* 36.9* 35*  MCV 97.8 94.2   < > 97.6  --  93.8 92.9  --   PLT 134* 110*   < > 118*  --  110* 142* 164   < > = values in this interval not displayed.   Lab Results  Component Value Date   TSH 2.47 01/26/2018    Assessment/Plan  1. Other chronic pain -Continue Fentanyl patch and Tramadol PRN  2. Poor appetite - will continue Magic cup, med Pass, super foods and Remeron  3. Pressure injury of sacral region, unstageable ([place]) - continue  daily wound treatment with inhouse wound consult, turn to sides, keep skin clean and dry, add air mattress over bed  4. Chronic obstructive pulmonary disease, unspecified COPD type ([place]) - current O2 sat 96%, continue O2 @ 4L/min via [place] continuously, Mucinex ER 600 mg twice a day, ipratropium bromide 0.02% inhale 1 vial via nebulizer every 6 hours  5. Seizure disorder ([place]) - no seizures, continue Phenytoin, Keppra and Ativan    Family/ staff Communication: Discussed plan of care with resident.  Labs/tests ordered: None  Goals of care:   Long-term care/Palliative Care   Durenda Age, DNP, FNP-BC [facility] [phone] (Monday-Friday 8:00 a.m. - 5:00 p.m.) [phone] (after hours)

## 2018-09-01 ENCOUNTER — Non-Acute Institutional Stay (SKILLED_NURSING_FACILITY): Payer: MEDICARE | Admitting: Internal Medicine

## 2018-09-01 ENCOUNTER — Non-Acute Institutional Stay: Payer: MEDICARE | Admitting: Licensed Clinical Social Worker

## 2018-09-01 ENCOUNTER — Encounter: Payer: Self-pay | Admitting: Internal Medicine

## 2018-09-01 ENCOUNTER — Other Ambulatory Visit: Payer: Self-pay | Admitting: Internal Medicine

## 2018-09-01 DIAGNOSIS — F419 Anxiety disorder, unspecified: Secondary | ICD-10-CM | POA: Diagnosis not present

## 2018-09-01 DIAGNOSIS — Z515 Encounter for palliative care: Secondary | ICD-10-CM

## 2018-09-01 DIAGNOSIS — R52 Pain, unspecified: Secondary | ICD-10-CM | POA: Diagnosis not present

## 2018-09-01 MED ORDER — MORPHINE SULFATE (CONCENTRATE) 20 MG/ML PO SOLN: ORAL | 0 refills | Status: AC

## 2018-09-01 MED ORDER — FENTANYL 25 MCG/HR TD PT72: 1.0000 | MEDICATED_PATCH | TRANSDERMAL | 0 refills | Status: AC

## 2018-09-01 NOTE — Telephone Encounter (Signed)
This encounter was created in error - please disregard.

## 2018-09-01 NOTE — Progress Notes (Signed)
Location:    [facility] Room Number: 107/A Place of Service:  SNF [phone]) Provider:  Loraine Maple, MD  Patient Care Team: Hendricks Limes, MD as PCP - General (Internal Medicine) Berneta Sages, NP as Nurse Practitioner (Adult Health Nurse Practitioner)  Extended Emergency Contact Information Primary Emergency Contact: Titus Mould Mobile Phone: [phone] Relation: Daughter Secondary Emergency Contact: [place] Phone: [phone] Mobile Phone: [phone] Relation: Spouse  Code Status:  DNR Goals of care: Advanced Directive information Advanced Directives 09/01/2018  Does Patient Have a Medical Advance Directive? Yes  Type of Advance Directive Out of facility DNR (pink MOST or yellow form)  Does patient want to make changes to medical advance directive? No - Patient declined  Would patient like information on creating a medical advance directive? No - Patient declined     Chief Complaint  Patient presents with  . Acute Visit    Pain Management  End-of-life care  HPI:  Pt is a 85 y.o. male seen today for an acute visit for pain management as well as end-of-life care.  Patient is a very pleasant elderly male history of end-stage COPD care measures.  He has been followed by palliative care as well.  Continues to decline and today is increasingly weak and appears to be in pain per nursing   He currently has a Duragesic patch 25 mcg a day which was started last week.  There was also consideration for Roxanol- but there were concerns for oversedation as he had just been started on Duragesic patch but this was to be monitored closely.  Today it appears he would benefit from the Roxanol per nursing and from what I see today.  He continues to be significantly weak which has progressed.  And is complaining of some back pain he does have a history of a sacral wound which has deteriorated.   Pulse is somewhat elevated around 120 I which could indicateincreased persistent pain as well--his pulse is usually a bit lower than this and I suspect is also the result of deconditioning.  There is some use of accessory muscles although he does not appear to be in distress but does appear to be somewhat uncomfortable and would benefit from additional pain medication  He does speak with me today but again appears increasingly weak and takes quite a bit of effort to even speak a few words.       Past Medical History:  Diagnosis Date  . Asthma   . Emphysema of lung ([place])   . Prostate cancer ([place])   . Seizures ([place])    Past Surgical History:  Procedure Laterality Date  . HERNIA REPAIR      No Known Allergies  Outpatient Encounter Medications as of 09/01/2018  Medication Sig  . acetaminophen (TYLENOL) 325 MG tablet Take 650 mg by mouth every 6 (six) hours. For pain  . bisacodyl (DULCOLAX) 10 MG suppository Constipation (2 of 4): If not relieved by MOM, give 10 mg Bisacodyl suppositiory rectally X 1 dose in 24 hours as needed (Do not use constipation standing orders for residents with renal failure/CFR less than 30. Contact MD for orders)  . Cholecalciferol (VITAMIN D3) 1000 units CAPS Take 1,000 Units by mouth daily at 12 noon.   . fentaNYL (DURAGESIC) 25 MCG/HR Place 1 patch onto the skin every 3 (three) days.  . furosemide (LASIX) 20 MG tablet Take 20 mg by mouth daily as needed for  edema.  . guaiFENesin (MUCINEX) 600 MG 12 hr tablet Take 1 tablet (600 mg total) by mouth 2 (two) times daily.  Marland Kitchen ipratropium (ATROVENT) 0.02 % nebulizer solution Take 0.5 mg by nebulization every 6 (six) hours.  . levETIRAcetam (KEPPRA) 1000 MG tablet Take 1,000 mg by mouth daily.  . magnesium hydroxide (MILK OF MAGNESIA) 400 MG/5ML suspension Constipation (1 of 4): If no BM in 3 days, give 30 cc Milk of Magnesium p.o. x 1 dose in 24 hours as needed (Do not use standing constipation orders for residents  with renal failure CFR less than 30. Contact MD for orders)  . mirtazapine (REMERON) 15 MG tablet Take 7.5 mg by mouth at bedtime.   Marland Kitchen morphine (ROXANOL) 20 MG/ML concentrated solution Give 0.125 ml=2.5 mg SL Q 2hours prn pain/dyspnea  . Multiple Vitamin (MULTIVITAMIN WITH MINERALS) TABS tablet Take 1 tablet by mouth daily.  . NON FORMULARY Magic Cup bid to promote weight gain  . NON FORMULARY Give 120 ML MED PASS QID TO PROMOTE WEIGHT GAIN AND WOUND HEALING  . omeprazole (PRILOSEC) 20 MG capsule Take 1 capsule (20 mg total) by mouth daily.  . ondansetron (ZOFRAN) 4 MG tablet Take 4 mg by mouth every 8 (eight) hours as needed for nausea or vomiting.  . OXYGEN Nasal O2 2/ min for hypoxia  . phenytoin (DILANTIN) 100 MG ER capsule Take 1 capsule (100 mg total) by mouth 2 (two) times daily.  . polyethylene glycol (MIRALAX / GLYCOLAX) packet Take 17 g by mouth daily as needed for mild constipation.  . Sodium Phosphates (RA SALINE ENEMA RE) Constipation (3 of 4): If not relieved by Biscodyl suppository, give disposable Saline Enema rectally X 1 dose/24 hrs as needed (Do not use constipation standing orders for residents with renal failure/CFR less than 30. Contact MD for orders)    Constipation (4 of 4): Contact MD as needed if no results from enema (Nursing Measure)  . traMADol (ULTRAM) 50 MG tablet 50 mg. Give 1 PO 30 mins prior to wound care & every 6 hours prn x 4 doses.   No facility-administered encounter medications on file as of 09/01/2018.     Review of Systems This is limited since patient speaking is somewhat limited but he does indicate he is having some discomfort more so on his back  There is no immunization history on file for this patient. Pertinent  Health Maintenance Due  Topic Date Due  . PNA vac Low Risk Adult (1 of 2 - PCV13) 09/30/2018 (Originally 12/06/1991)  . INFLUENZA VACCINE  11/26/2018   Fall Risk  01/26/2018  Falls in the past year? No   Functional Status Survey:    Temperature is 97.5 pulse pressure is 120 I got 110 when I auscultated- blood pressure 106/61 oxygen saturation 94% on room air-respirations of 20   Physical Exam   In general this is a frail elderly male who does not appear to be in acute distress but is significantly weak and is complaining of some increased pain he appears to be declining.  His skin is warm and dry.  He does have a history of sacral ulcer per wound care this has gradually deteriorated.  Mouth his mucous membranes do appear to be dry.  Chest he has shallow air entry could not really appreciate overt congestion there is some use of accessory muscles especially when staff moved him for personal care  Heart he is tachycardic I got a pulse around 110.  Abdomen is  soft does not appear to be tender there are active bowel sounds.  Musculoskeletal l skeletal marked by extreme frailty with weakness which appears to be increasing he can move his extremities but is very weak.  Neurologic marked by extreme weakness he is alert and does talk some but requires quite a bit of effort       Labs reviewed: Recent Labs    06/14/18 1447 06/15/18 0302  06/18/18 0812  06/21/18 0956 07/07/18 07/21/18 07/22/18  NA  --  139  --  139   < > 138 138 140 141  K  --  4.3  --  4.6   < > 4.6 4.9 5.2 5.4*  CL  --  92*  --  91*  --  89* 92  --  93  CO2  --  42*  --  42*  --  42* 40  --  42  GLUCOSE  --  82  --  107*  --  96  --   --   --   BUN  --  11  --  13  --  12 15 19 17  17   CREATININE  --  0.57*   < > 0.59*  --  0.61 0.6 0.5* 0.4*  0.4*  CALCIUM  --  8.9  --  8.9  --  9.0 9.3  --  9.2  MG 1.7  --   --   --   --   --   --   --   --   PHOS 2.5  --   --   --   --   --   --   --   --    < > = values in this interval not displayed.   Recent Labs    06/14/18 0906 06/18/18 0812 06/21/18 0956  AST 25 28 25   ALT 12 14 14   ALKPHOS 88 73 66  BILITOT 0.4 0.3 0.4  PROT 7.4 7.1 6.7  ALBUMIN 3.7 3.6 3.2*   Recent Labs     06/14/18 0906 06/15/18 0302  06/18/18 0812  06/19/18 0436 06/21/18 0956 07/07/18  WBC 3.9* 2.9*   < > 4.7  --  3.6* 3.3* 4.4  NEUTROABS 2.8 1.5*  --  3.1  --   --   --   --   HGB 12.0* 10.4*   < > 12.0*   < > 10.0* 11.2* 11.6*  HCT 40.9 34.4*   < > 40.8   < > 33.2* 36.9* 35*  MCV 97.8 94.2   < > 97.6  --  93.8 92.9  --   PLT 134* 110*   < > 118*  --  110* 142* 164   < > = values in this interval not displayed.   Lab Results  Component Value Date   TSH 2.47 01/26/2018   No results found for: HGBA1C No results found for: CHOL, HDL, LDLCALC, LDLDIRECT, TRIG, CHOLHDL  Significant Diagnostic Results in last 30 days:  No results found.  Assessment/Plan  #1 pain management with end-of-life care- at this point emphasis is on comfort as noted previously- he appears he would benefit from additional pain medication and will start Roxanol-will start 2.5 mg every 4 hours as needed but this can certainly be titrated up if needed.  Also will add Ativan every 4 hours to help with anxiety.  Apparently patient did not take anything p.o. earlier today will discontinue his Remeron proton pump inhibitor Dilantin and Keppra as  he appears not to be taking these anymore and I suspect would make him uncomfortable trying to  take them  Also will DC the tramadol  [phone]

## 2018-09-01 NOTE — Progress Notes (Signed)
COMMUNITY PALLIATIVE CARE SW NOTE  PATIENT NAME: Brent [name] DOB: 1926/06/06 MRN: [id number]  PRIMARY CARE PROVIDER: Hendricks Limes, MD  RESPONSIBLE PARTY:  Acct ID - Guarantor Home Phone Work Phone Relationship Acct Type  [id number] DUC, [phone]  Self P/F     [address] [place], [place],  [address]  Due to the COVID-19 crisis, this virtual check-in visit was done via telephone from my office and it was initiated and consent given by patient and or family.   PLAN OF CARE and INTERVENTIONS:             1. GOALS OF CARE/ ADVANCE CARE PLANNING:  Goal is for patient to be comfortable. 2. SOCIAL/EMOTIONAL/SPIRITUAL ASSESSMENT/ INTERVENTIONS:  SW conducted a Sales executive visit with patient's daughter, Brent [name], per the request of NP, Brent [name].  SW provided prebereavement counseling due to her stepfather's decline.  Patient's wife has not been able to visit patient due to her high risk of obtaining COVID-19.  The [facility] staff is keeping the family updated on patient's status. 3. PATIENT/CAREGIVER EDUCATION/ COPING:  Patients daughter copes by expressing her feelings openly. 4. PERSONAL EMERGENCY PLAN:  Per facility protocol. 5. COMMUNITY RESOURCES COORDINATION/ HEALTH CARE NAVIGATION:  None. 6. FINANCIAL/LEGAL CONCERNS/INTERVENTIONS:  None.     SOCIAL HX:  Social History   Tobacco Use  . Smoking status: Former Research scientist (life sciences)  . Smokeless tobacco: Never Used  Substance Use Topics  . Alcohol use: Not Currently    CODE STATUS:  DNR ADVANCED DIRECTIVES: N MOST FORM COMPLETE:  N  HOSPICE EDUCATION PROVIDED: N Duration of visit and documentation:  45 minutes.      Brent Corn Ahnesti Townsend, LCSW

## 2018-09-02 ENCOUNTER — Other Ambulatory Visit: Payer: Self-pay

## 2018-09-02 ENCOUNTER — Encounter: Payer: Self-pay | Admitting: Adult Health

## 2018-09-26 DEATH — deceased

## 2019-01-27 ENCOUNTER — Ambulatory Visit: Payer: MEDICARE | Admitting: Neurology

## 2020-06-08 IMAGING — CT CT HEAD W/O CM
3 of 4 series · 15 of 47 positions shown, 18 images · non-contrast
Comparison: None.

CLINICAL DATA: Seizure

EXAM:
CT HEAD WITHOUT CONTRAST
TECHNIQUE: Contiguous axial images were obtained from the base of the skull
through the vertex without intravenous contrast.

[Series 2: head 5.00 hr40 s3 ibhc · axial · 0.41mm/px · z∈[-659,-539]mm · 9 of 30 slices shown, 12 images]
[im 3/30  brain]
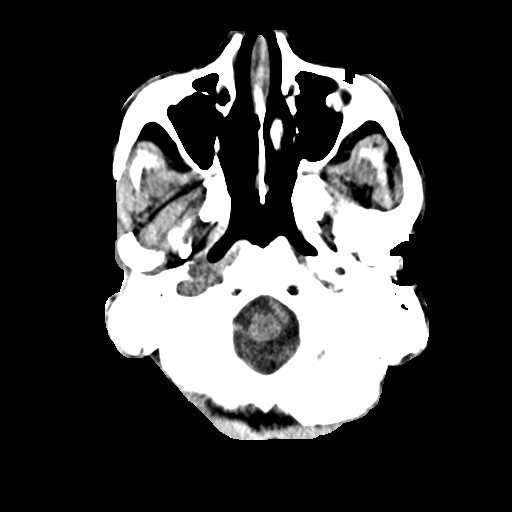
[im 3/30  bone]
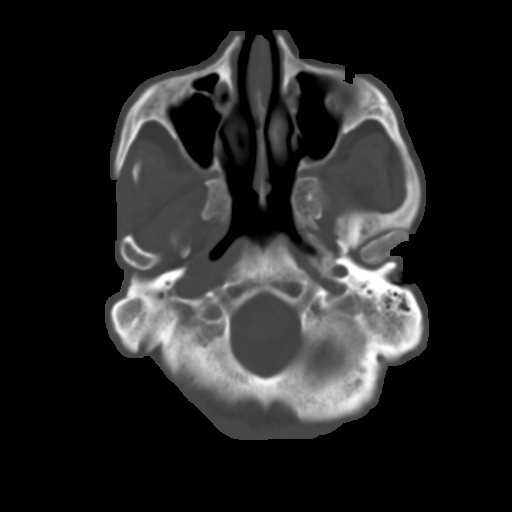
[im 7/30  brain]
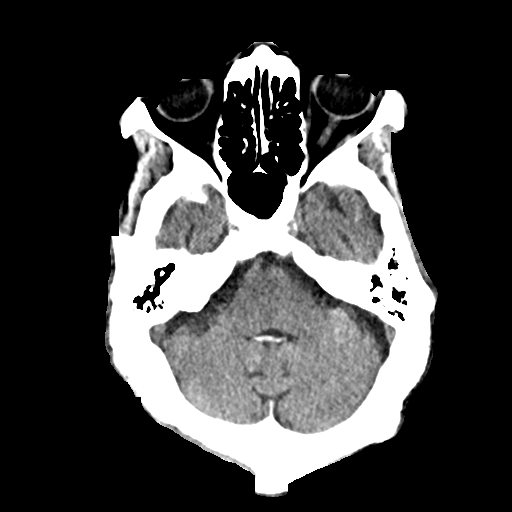
[im 9/30  brain]
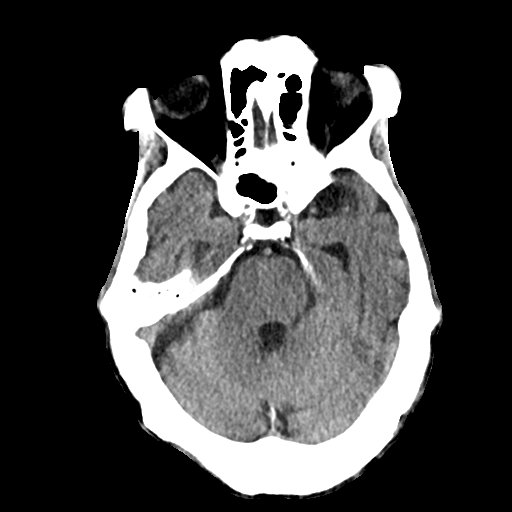
[im 13/30  brain]
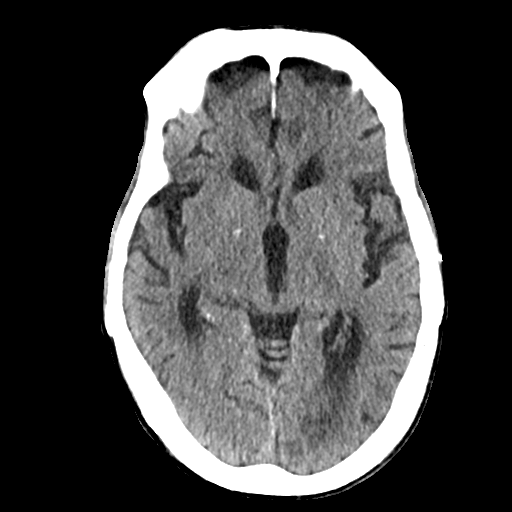
[im 15/30  brain]
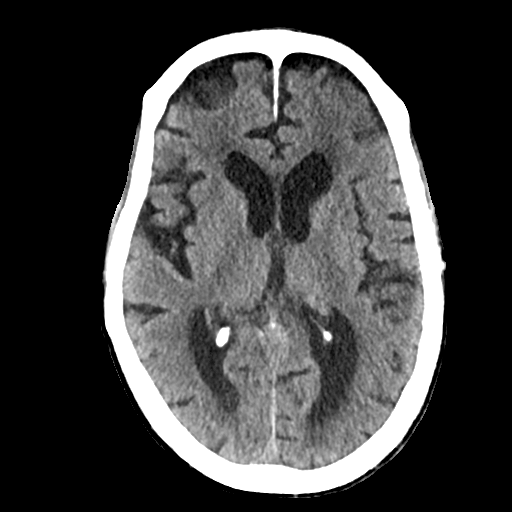
[im 15/30  bone]
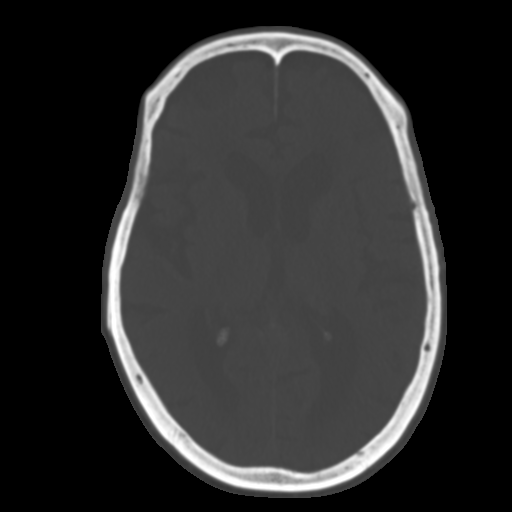
[im 17/30  brain]
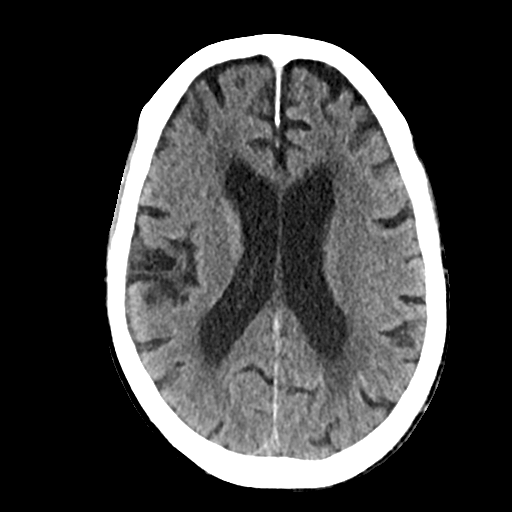
[im 21/30  brain]
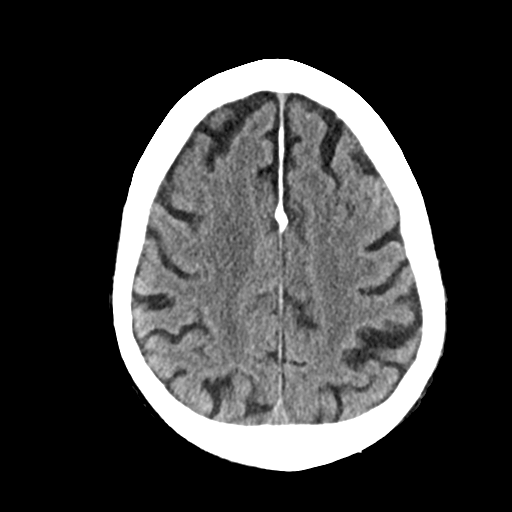
[im 23/30  brain]
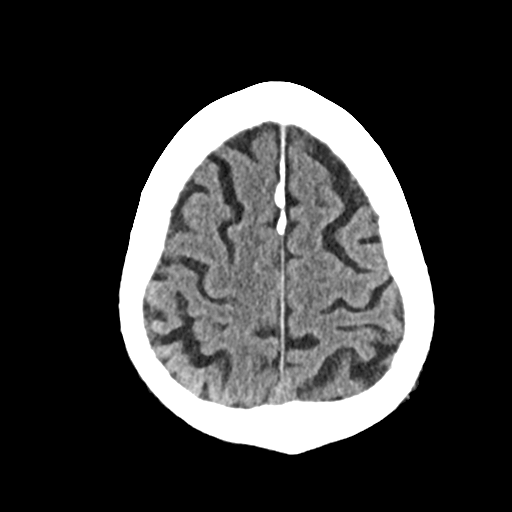
[im 27/30  brain]
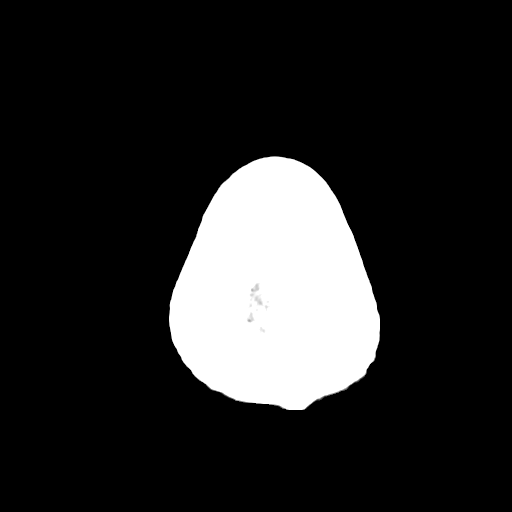
[im 27/30  bone]
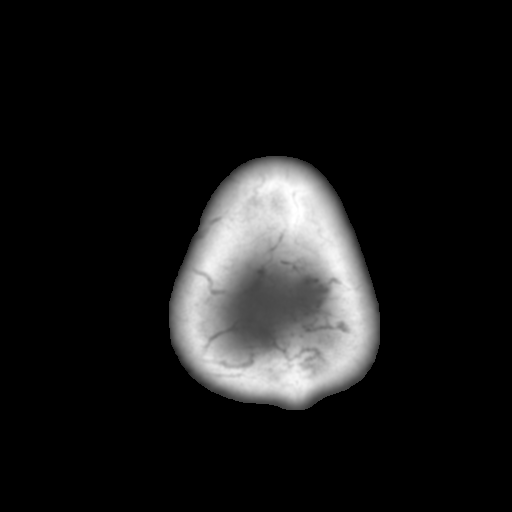

[Series 4: head 3.00 hr40 s3 sag · sagittal · 0.28mm/px · 3 of 51 slices shown]
[im 17/51  brain]
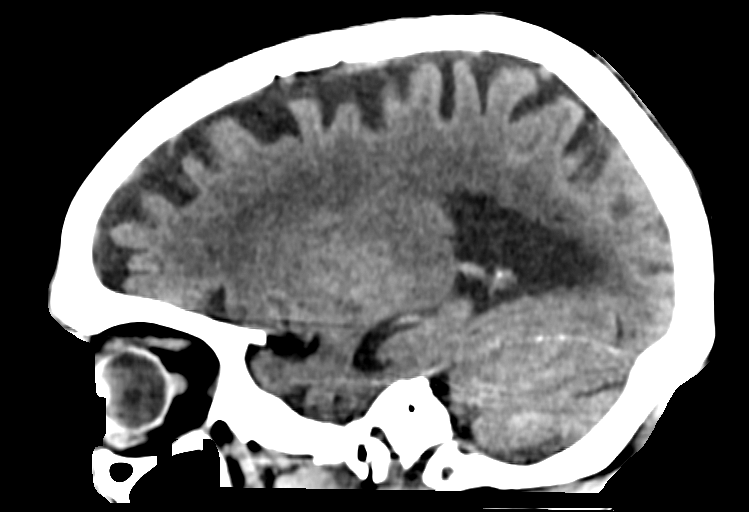
[im 26/51  brain]
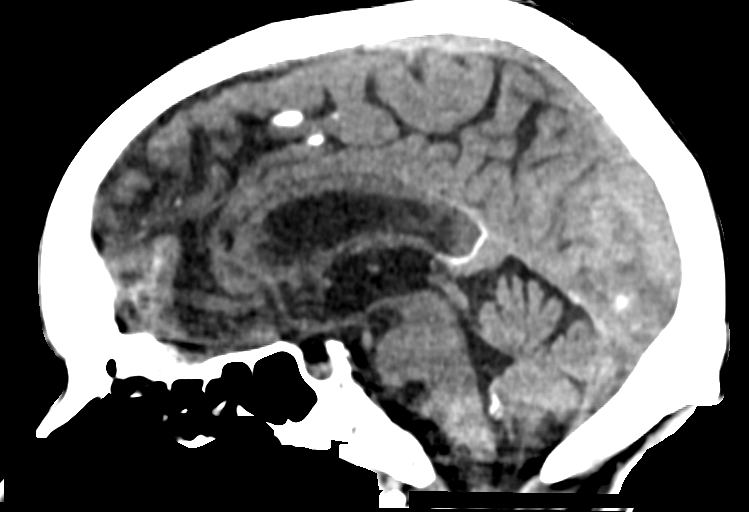
[im 34/51  brain]
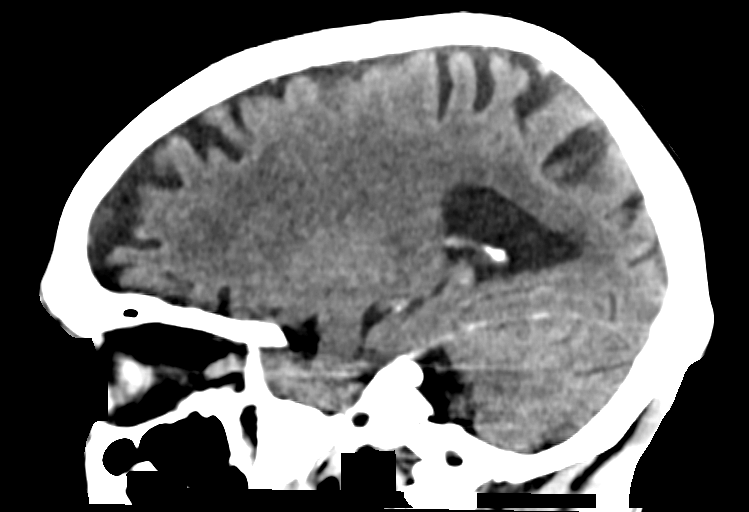

[Series 6: head 3.00 hr40 s3 cor · coronal · 0.28mm/px · 3 of 71 slices shown]
[im 24/71  brain]
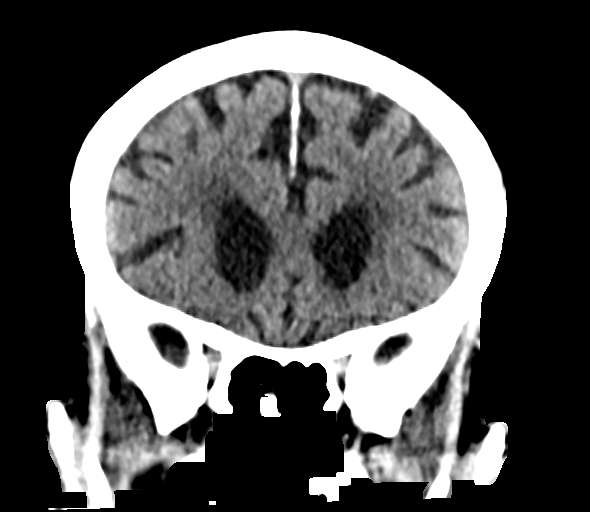
[im 32/71  brain]
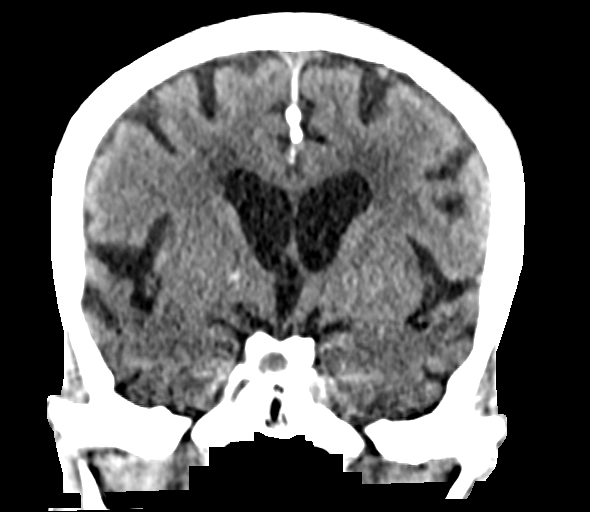
[im 39/71  brain]
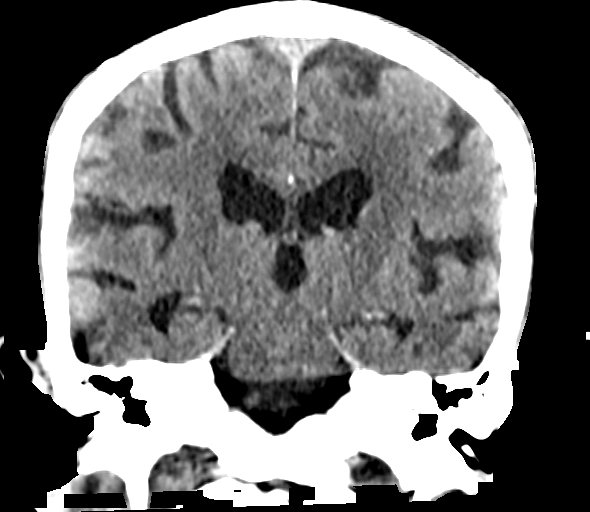

[15 of 47 positions shown; findings below may reference images not displayed]

FINDINGS: Brain: Cerebral volume loss without specific pattern, mild to
moderate. Mild, essentially age congruent, chronic small vessel
ischemic change in the cerebral white matter. No infarct,
hemorrhage, hydrocephalus, masslike finding, or collection

Vascular: Atherosclerotic calcification

Skull: Negative

Sinuses/Orbits: Negative
IMPRESSION: No acute or reversible finding. No cortical finding to explain
seizure.

## 2020-10-29 IMAGING — DX DG CHEST 1V PORT
1 series · 1 of 1 positions shown · non-contrast
Comparison: 06/14/2018

CLINICAL DATA: Fever and hypoxia.

EXAM:
PORTABLE CHEST 1 VIEW

[chest ap]
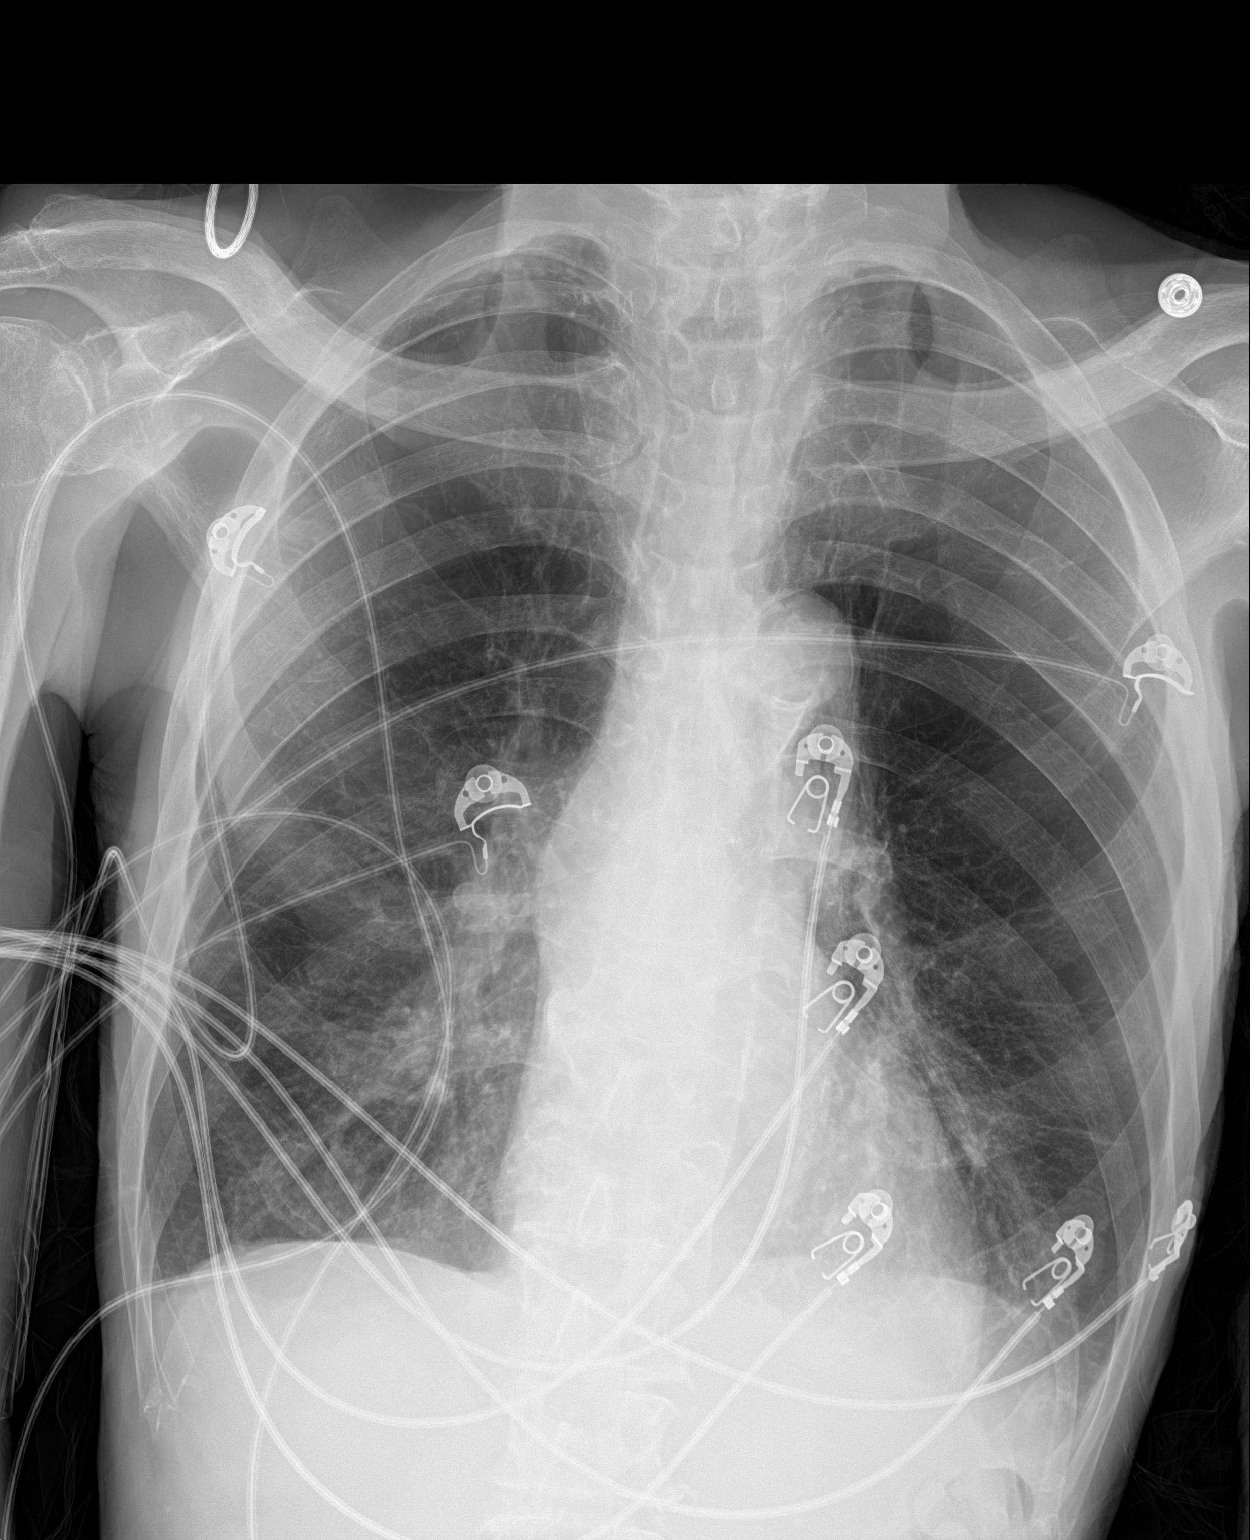

[1 of 1 positions shown; findings below may reference images not displayed]

FINDINGS: Cardiomediastinal silhouette is unchanged.

Emphysema again noted.

There is no evidence of focal airspace disease, pulmonary edema,
suspicious pulmonary nodule/mass, pleural effusion, or pneumothorax.

No acute bony abnormalities are identified.
IMPRESSION: Emphysema without evidence of acute cardiopulmonary disease.
